# Patient Record
Sex: Male | Born: 1960 | Race: White
Health system: Southern US, Community
[De-identification: ages and names within clinical notes are randomized; demographics above are authoritative.]

## PROBLEM LIST (undated history)

## (undated) DIAGNOSIS — C801 Malignant (primary) neoplasm, unspecified: Secondary | ICD-10-CM

## (undated) DIAGNOSIS — R0602 Shortness of breath: Secondary | ICD-10-CM

## (undated) DIAGNOSIS — K5790 Diverticulosis of intestine, part unspecified, without perforation or abscess without bleeding: Secondary | ICD-10-CM

## (undated) DIAGNOSIS — Z9889 Other specified postprocedural states: Secondary | ICD-10-CM

## (undated) DIAGNOSIS — Z86010 Personal history of colonic polyps: Secondary | ICD-10-CM

## (undated) HISTORY — PX: EYE SURGERY: SHX253

## (undated) HISTORY — PX: COLONOSCOPY, REMOVAL OF TUMOR, POLYP, OR OTHER LESION BY SNARE TECHNIQUE: SHX3473

## (undated) HISTORY — DX: Shortness of breath: R06.02

## (undated) HISTORY — DX: Malignant (primary) neoplasm, unspecified: C80.1

## (undated) HISTORY — PX: TONSILLECTOMY: SUR1361

## (undated) HISTORY — DX: Other specified postprocedural states: Z98.890

## (undated) HISTORY — DX: Diverticulosis of intestine, part unspecified, without perforation or abscess without bleeding: K57.90

## (undated) HISTORY — DX: Personal history of colonic polyps: Z86.010

---

## 2015-10-01 DIAGNOSIS — Z9889 Other specified postprocedural states: Secondary | ICD-10-CM

## 2015-10-01 DIAGNOSIS — Z86010 Personal history of colonic polyps: Secondary | ICD-10-CM

## 2015-10-01 HISTORY — DX: Other specified postprocedural states: Z98.890

## 2015-10-01 HISTORY — DX: Personal history of colonic polyps: Z86.010

## 2015-12-07 ENCOUNTER — Ambulatory Visit
Admission: RE | Admit: 2015-12-07 | Discharge: 2015-12-07 | Disposition: A | Discharge status: Home / Self Care | Payer: BC Managed Care – PPO | Source: Ambulatory Visit | Attending: Gastroenterology | Admitting: Gastroenterology

## 2015-12-07 ENCOUNTER — Ambulatory Visit: Payer: BC Managed Care – PPO | Admitting: Anesthesiology

## 2015-12-07 ENCOUNTER — Ambulatory Visit: Payer: BC Managed Care – PPO | Admitting: Gastroenterology

## 2015-12-07 ENCOUNTER — Ambulatory Visit: Payer: Self-pay

## 2015-12-07 ENCOUNTER — Encounter
Admission: RE | Disposition: A | Discharge status: Home / Self Care | Payer: Self-pay | Source: Ambulatory Visit | Attending: Gastroenterology

## 2015-12-07 DIAGNOSIS — K648 Other hemorrhoids: Secondary | ICD-10-CM | POA: Insufficient documentation

## 2015-12-07 DIAGNOSIS — Z1211 Encounter for screening for malignant neoplasm of colon: Secondary | ICD-10-CM | POA: Insufficient documentation

## 2015-12-07 DIAGNOSIS — D123 Benign neoplasm of transverse colon: Secondary | ICD-10-CM | POA: Insufficient documentation

## 2015-12-07 DIAGNOSIS — Z8371 Family history of colonic polyps: Secondary | ICD-10-CM | POA: Insufficient documentation

## 2015-12-07 DIAGNOSIS — D120 Benign neoplasm of cecum: Secondary | ICD-10-CM | POA: Insufficient documentation

## 2015-12-07 DIAGNOSIS — K5730 Diverticulosis of large intestine without perforation or abscess without bleeding: Secondary | ICD-10-CM | POA: Insufficient documentation

## 2015-12-07 DIAGNOSIS — Z86010 Personal history of colonic polyps: Secondary | ICD-10-CM | POA: Insufficient documentation

## 2015-12-07 DIAGNOSIS — Z800 Family history of malignant neoplasm of digestive organs: Secondary | ICD-10-CM | POA: Insufficient documentation

## 2015-12-07 DIAGNOSIS — K644 Residual hemorrhoidal skin tags: Secondary | ICD-10-CM | POA: Insufficient documentation

## 2015-12-07 HISTORY — PX: COLONOSCOPY, WITH BIOPSY: SHX3468

## 2015-12-07 SURGERY — COLONOSCOPY, WITH BIOPSY
Anesthesia: Anesthesia General | Site: Abdomen | Wound class: Clean Contaminated

## 2015-12-07 MED ORDER — PROPOFOL INFUSION 10 MG/ML
INTRAVENOUS | Status: DC | PRN
Start: 2015-12-07 — End: 2015-12-07
  Administered 2015-12-07: 70 mg via INTRAVENOUS

## 2015-12-07 MED ORDER — LIDOCAINE HCL 2 % IJ SOLN
INTRAMUSCULAR | Status: DC | PRN
Start: 2015-12-07 — End: 2015-12-07
  Administered 2015-12-07: 30 mg via INTRAVENOUS

## 2015-12-07 MED ORDER — PROPOFOL INFUSION 10 MG/ML
INTRAVENOUS | Status: DC | PRN
Start: 2015-12-07 — End: 2015-12-07
  Administered 2015-12-07: 180 ug/kg/min via INTRAVENOUS

## 2015-12-07 MED ORDER — LACTATED RINGERS IV SOLN
INTRAVENOUS | Status: DC
Start: 2015-12-07 — End: 2015-12-07

## 2015-12-07 SURGICAL SUPPLY — 19 items
CANISTER 1000CC (Procedure Accessories) ×2 IMPLANT
FORCEPS BIOPSY L240 CM MICROMESH TEETH STREAMLINE CATHETER NEEDLE (Instrument) IMPLANT
FORCEPS BIOPSY L240 CM STANDARD CAPACITY (Instrument) ×1
FORCEPS BX STD CPC RJ 4 2.2MM 240CM STRL (Instrument) ×1
GOWN ISO YELLOW UNIVERSAL (Gown) ×4 IMPLANT
KIT UNIVERSAL IRRIGATION SOL (Kits) ×2 IMPLANT
SOFT-CUF 2T ADULT SUB-MIN (Cuff) ×2 IMPLANT
SOLN LUBRICATING JELLY 4.25OZ (Irrigation Solutions) ×2 IMPLANT
SPONGE GAUZE L4 IN X W4 IN 16 PLY (Dressing) ×10
SPONGE GAUZE L4 IN X W4 IN 16 PLY MAXIMUM ABSORBENT USP TYPE VII (Dressing) ×10 IMPLANT
SPONGE GZE CTTN CRTY 4X4IN LF NS 16 PLY (Dressing) ×10
SYRINGE 50 ML GRADUATE NONPYROGENIC DEHP (Syringes, Needles) ×1
SYRINGE 50 ML GRADUATE NONPYROGENIC DEHP FREE PVC FREE BD MEDICAL (Syringes, Needles) ×1 IMPLANT
SYRINGE MED 50ML LF STRL GRAD N-PYRG (Syringes, Needles) ×1
WATER STERILE PLASTIC POUR BOTTLE 1000 (Irrigation Solutions) ×1
WATER STERILE PLASTIC POUR BOTTLE 1000 ML (Irrigation Solutions) ×1 IMPLANT
WATER STERILE PLASTIC POUR BOTTLE 250 ML (Irrigation Solutions) ×1 IMPLANT
WATER STRL 1000ML PLS PR BTL LF (Irrigation Solutions) ×1
WATER STRL 250ML LF PLS PR BTL (Irrigation Solutions) ×1

## 2015-12-07 NOTE — Transfer of Care (Signed)
Anesthesia Transfer of Care Note    Patient: Jonathon Hudson    Procedures performed: Procedure(s):  COLONOSCOPY, BIOPSY    Anesthesia type: General TIVA    Patient location:[facility]    Last vitals:   Filed Vitals:    12/07/15 0920   BP: 99/63   Pulse: 55   Temp: 36.4 C (97.5 F)   Resp: 15   SpO2: 99%       Post pain: Patient not complaining of pain, continue current therapy      Mental Status:awake    Respiratory Function: tolerating room air    Cardiovascular: stable    Nausea/Vomiting: patient not complaining of nausea or vomiting    Hydration Status: adequate    Post assessment: no apparent anesthetic complications, no reportable events and no evidence of recall    Signed by: Leane Para  12/07/2015 9:20 AM

## 2015-12-07 NOTE — Anesthesia Postprocedure Evaluation (Signed)
Anesthesia Post Evaluation    Patient: Jonathon Hudson    Procedures performed: Procedure(s):  COLONOSCOPY, BIOPSY    Anesthesia type:  IV General    Patient location: [facility]    Last vitals:   Filed Vitals:    12/07/15 0935   BP: 116/78   Pulse: 54   Temp:  36.4 at 9:20 AM    Resp: 20   SpO2: 99%       Post pain: Patient not complaining of pain, continue current therapy     Mental Status:awake and alert     Respiratory Function: tolerating room air    Cardiovascular: stable    Nausea/Vomiting: patient not complaining of nausea or vomiting    Hydration Status: adequate    Post assessment: no apparent anesthetic complications and no reportable events    Tally Due, MD  12/07/2015  9:38 AM

## 2015-12-07 NOTE — Anesthesia Preprocedure Evaluation (Signed)
Anesthesia Evaluation    AIRWAY    Mallampati: III    TM distance: >3 FB  Neck ROM: full  Mouth Opening:full   CARDIOVASCULAR    regular       DENTAL    no notable dental hx     PULMONARY    clear to auscultation     OTHER FINDINGS                      Anesthesia Plan    ASA 1     general               (Patient interviewed,History, medications, allergies reviewed, patient examined.    Able to climb 2 FOSs without cardiorespiratory Sx.  Discussed IVGA.  Explained risks associated with existing medical conditions.  Answered all questions to patient's satisfaction.  Patient was willing to proceed.    Tally Due, MD  12/07/2015  8:00 AM  )                  informed consent obtained    Plan discussed with CRNA.

## 2015-12-07 NOTE — Discharge Instructions (Signed)
Endoscopy Discharge Instructions  General Instructions:  1. Following sedation, your judgement, perception, and coordination are considered impaired. Even though you may feel awake and alert, you are considered legally intoxicated. Therefore, until the next morning;   Do not Drive   Do not operate appliances or equipment that requires reaction time (e.g.    Stove, electrical tools, machinery)   Do not sign legal documents or be involved in important decisions.   Do not smoke if alone   Do not drink alcoholic beverages   Go directly home and rest for several hours before resuming your routine    activities.   It is highly recommended to have a responsible adult stay with you for the   next 24 hours    2. Tenderness, swelling or pain may occur at the IV site where you received sedation. If you experience this, apply warm soaks to the area. Notify your physician if this persists.    Instructions Specific To Procedures - Report To Physician Any Of The Following:    Upper Endoscopy, ERCP, Dilations   1. Pain in Chest   2. Nausea/vomitting   3. Fevers/Chills within 24 hours after procedure. Temp>101deg F   4. Severe and persistent abdominal pain and bloating     In Addition:   Mild throat soreness may follow this procedure. Warm salt water gargling or   lozenges of your choice will most likely relieve your discomfort or cold drinks and   popsicles.     Colon/Sigmoidoscopy/Proctoscopy   1. Severe and persistent abdominal pain/bloating which does not subside within 2-3 hours   2. Large amount of rectal bleeding (some mucosal blood streaking may occur, especially if biopsy or polypectomy was done or if hemorrhoids are present.   3. Nausea/vomitting   4. Fevers/Chills within 24 hours after procedure. Temp>101deg F     In Addition:\     If polyp has been removed, DO NOT take aspirin or aspirin containing products   (e.g. Anacin, Alka Seltzer, Bufferin, Etc.) or non-steroidal anti-inflammatory drugs  (e.g. Advil, Motrin,  etc.) for 14 days unless otherwise advised otherwise on your Patient Procedure Report and Recommendations. Tylenol or extra Strength Tylenol is permitted.        Additional Discharge Instructions  Your diet after the procedure: No restriction.  Special Instructions: See Procedure Patient Report Recommendations    If biopsies were taken, call Dr. Yicel Shannon at [phone] in one week for results    Patient education literature given; Yes      If you have questions or problems contact your MD immediately. If you need immediate attention, call your MD, 911 and/or go to nearest emergency room.

## 2015-12-07 NOTE — H&P (Signed)
GI PRE PROCEDURE NOTE    Proceduralist Comments:   Review of Systems and Past Medical / Surgical History performed: Yes     Indications:Family history of colon cancer and History of colonic polyps        Physical Exam / Laboratory Data (If applicable)       General: Alert and cooperative  Lungs: Lungs clear to auscultation  Cardiac: RRR, normal S1S2.    Abdomen: Soft, non tender. Normal active bowel sounds  Other:         Planned Sedation:   Deep sedation with anesthesia    Attestation:   Jonathon Hudson has been reassessed immediately prior to the procedure and is an appropriate candidate for the planned sedation and procedure. Risks, benefits and alternatives to the planned procedure and sedation have been explained to the patient or guardian:  yes        Signed by: Virgina Norfolk

## 2015-12-08 LAB — LAB USE ONLY - HISTORICAL SURGICAL PATHOLOGY

## 2015-12-12 ENCOUNTER — Encounter: Payer: Self-pay | Admitting: Gastroenterology

## 2018-11-13 ENCOUNTER — Other Ambulatory Visit (FREE_STANDING_LABORATORY_FACILITY): Payer: BC Managed Care – PPO

## 2018-11-13 DIAGNOSIS — Z125 Encounter for screening for malignant neoplasm of prostate: Secondary | ICD-10-CM

## 2018-11-13 DIAGNOSIS — G933 Postviral fatigue syndrome: Secondary | ICD-10-CM

## 2018-11-13 DIAGNOSIS — Z1159 Encounter for screening for other viral diseases: Secondary | ICD-10-CM

## 2018-11-13 DIAGNOSIS — Z139 Encounter for screening, unspecified: Secondary | ICD-10-CM

## 2018-11-13 LAB — LIPID PANEL
Cholesterol / HDL Ratio: 4.6
Cholesterol: 194 mg/dL (ref 0–199)
HDL: 42 mg/dL (ref 40–9999)
LDL Calculated: 131 mg/dL — ABNORMAL HIGH (ref 0–99)
Triglycerides: 107 mg/dL (ref 34–149)
VLDL Calculated: 21 mg/dL (ref 10–40)

## 2018-11-13 LAB — GFR: EGFR: >60

## 2018-11-13 LAB — CBC AND DIFFERENTIAL
Absolute NRBC: 0 10*3/uL (ref 0.00–0.00)
Basophils Absolute Automated: 0.05 10*3/uL (ref 0.00–0.08)
Basophils Automated: 0.8 %
Eosinophils Absolute Automated: 0.17 10*3/uL (ref 0.00–0.44)
Eosinophils Automated: 2.6 %
Hematocrit: 46.2 % (ref 37.6–49.6)
Hgb: 15.2 g/dL (ref 12.5–17.1)
Immature Granulocytes Absolute: 0.02 10*3/uL (ref 0.00–0.07)
Immature Granulocytes: 0.3 %
Lymphocytes Absolute Automated: 2.05 10*3/uL (ref 0.42–3.22)
Lymphocytes Automated: 31.7 %
MCH: 28.1 pg (ref 25.1–33.5)
MCHC: 32.9 g/dL (ref 31.5–35.8)
MCV: 85.6 fL (ref 78.0–96.0)
MPV: 11.2 fL (ref 8.9–12.5)
Monocytes Absolute Automated: 0.47 10*3/uL (ref 0.21–0.85)
Monocytes: 7.3 %
Neutrophils Absolute: 3.7 10*3/uL (ref 1.10–6.33)
Neutrophils: 57.3 %
Nucleated RBC: 0 /100 WBC (ref 0.0–0.0)
Platelets: 210 10*3/uL (ref 142–346)
RBC: 5.4 10*6/uL (ref 4.20–5.90)
RDW: 13 % (ref 11–15)
WBC: 6.46 10*3/uL (ref 3.10–9.50)

## 2018-11-13 LAB — COMPREHENSIVE METABOLIC PANEL
ALT: 22 U/L (ref 0–55)
AST (SGOT): 20 U/L (ref 5–34)
Albumin/Globulin Ratio: 1.3 (ref 0.9–2.2)
Albumin: 3.7 g/dL (ref 3.5–5.0)
Alkaline Phosphatase: 73 U/L (ref 38–106)
BUN: 12 mg/dL (ref 9.0–28.0)
Bilirubin, Total: 0.7 mg/dL (ref 0.2–1.2)
CO2: 26 mEq/L (ref 21–29)
Calcium: 9.2 mg/dL (ref 8.5–10.5)
Chloride: 106 mEq/L (ref 100–111)
Creatinine: 0.8 mg/dL (ref 0.5–1.5)
Globulin: 2.8 g/dL (ref 2.0–3.7)
Glucose: 93 mg/dL (ref 70–100)
Potassium: 4 mEq/L (ref 3.5–5.1)
Protein, Total: 6.5 g/dL (ref 6.0–8.3)
Sodium: 142 mEq/L (ref 136–145)

## 2018-11-13 LAB — FOLATE: Folate: 6.2 ng/mL

## 2018-11-13 LAB — C-REACTIVE PROTEIN HIGH SENSITIVE: C-Reactive Protein, High Sensitive: 0.14 mg/dL (ref 0.00–0.50)

## 2018-11-13 LAB — VITAMIN B12: Vitamin B-12: 426 pg/mL (ref 211–911)

## 2018-11-13 LAB — URIC ACID: Uric acid: 6.6 mg/dL (ref 3.6–9.7)

## 2018-11-13 LAB — HEPATITIS C ANTIBODY: Hepatitis C, AB: NONREACTIVE

## 2018-11-13 LAB — HIV AG/AB 4TH GENERATION: HIV Ag/Ab, 4th Generation: NONREACTIVE

## 2018-11-13 LAB — PSA: Prostate Specific Antigen, Total: 1.066 ng/mL (ref 0.000–4.000)

## 2018-11-13 LAB — VITAMIN D,25 OH,TOTAL: Vitamin D, 25 OH, Total: 19 ng/mL — ABNORMAL LOW (ref 30–100)

## 2018-11-13 LAB — HEMOLYSIS INDEX: Hemolysis Index: 14 (ref 0–18)

## 2018-11-16 LAB — EPSTEIN-BARR VIRUS VCA ANTIBODY PANEL
EBV EBNA Ab, IgG: 8.46 — ABNORMAL HIGH (ref <=0.90)
EBV VCA Ab, IgG: 5.83 — ABNORMAL HIGH (ref <=0.90)
EBV VCA Ab, IgM: 0.17 (ref <=0.90)

## 2018-12-03 ENCOUNTER — Ambulatory Visit: Payer: BC Managed Care – PPO | Admitting: Critical Care Medicine

## 2018-12-03 ENCOUNTER — Encounter
Admission: RE | Disposition: A | Discharge status: Home / Self Care | Payer: Self-pay | Source: Ambulatory Visit | Attending: Gastroenterology

## 2018-12-03 ENCOUNTER — Ambulatory Visit
Admission: RE | Admit: 2018-12-03 | Discharge: 2018-12-03 | Disposition: A | Discharge status: Home / Self Care | Payer: BC Managed Care – PPO | Source: Ambulatory Visit | Attending: Gastroenterology | Admitting: Gastroenterology

## 2018-12-03 ENCOUNTER — Encounter: Payer: Self-pay | Admitting: Anesthesiology

## 2018-12-03 ENCOUNTER — Ambulatory Visit: Payer: Self-pay

## 2018-12-03 DIAGNOSIS — Z800 Family history of malignant neoplasm of digestive organs: Secondary | ICD-10-CM

## 2018-12-03 DIAGNOSIS — Z8371 Family history of colonic polyps: Secondary | ICD-10-CM

## 2018-12-03 DIAGNOSIS — D123 Benign neoplasm of transverse colon: Secondary | ICD-10-CM | POA: Insufficient documentation

## 2018-12-03 DIAGNOSIS — Z86010 Personal history of colonic polyps: Secondary | ICD-10-CM | POA: Insufficient documentation

## 2018-12-03 DIAGNOSIS — K5730 Diverticulosis of large intestine without perforation or abscess without bleeding: Secondary | ICD-10-CM | POA: Insufficient documentation

## 2018-12-03 DIAGNOSIS — D120 Benign neoplasm of cecum: Secondary | ICD-10-CM | POA: Insufficient documentation

## 2018-12-03 DIAGNOSIS — K648 Other hemorrhoids: Secondary | ICD-10-CM | POA: Insufficient documentation

## 2018-12-03 HISTORY — PX: COLONOSCOPY, WITH BIOPSY: SHX3468

## 2018-12-03 SURGERY — COLONOSCOPY, WITH BIOPSY
Anesthesia: Anesthesia General | Site: Abdomen | Wound class: Clean Contaminated

## 2018-12-03 MED ORDER — PROPOFOL 10 MG/ML IV EMUL (WRAP)
INTRAVENOUS | Status: AC
Start: 2018-12-03 — End: ?
  Filled 2018-12-03: qty 50

## 2018-12-03 MED ORDER — LIDOCAINE HCL (PF) 2 % IJ SOLN
INTRAMUSCULAR | Status: AC
Start: 2018-12-03 — End: ?
  Filled 2018-12-03: qty 5

## 2018-12-03 MED ORDER — PROPOFOL INFUSION 10 MG/ML
INTRAVENOUS | Status: DC | PRN
Start: 2018-12-03 — End: 2018-12-03
  Administered 2018-12-03 (×2): 20 mg via INTRAVENOUS
  Administered 2018-12-03: 80 mg via INTRAVENOUS

## 2018-12-03 MED ORDER — LIDOCAINE HCL 2 % IJ SOLN
INTRAMUSCULAR | Status: DC | PRN
Start: 2018-12-03 — End: 2018-12-03
  Administered 2018-12-03: 40 mg via INTRAVENOUS

## 2018-12-03 MED ORDER — PROPOFOL INFUSION 10 MG/ML
INTRAVENOUS | Status: DC | PRN
Start: 2018-12-03 — End: 2018-12-03
  Administered 2018-12-03: 180 ug/kg/min via INTRAVENOUS

## 2018-12-03 MED ORDER — EPHEDRINE SULFATE 50 MG/ML IJ/IV SOLN (WRAP)
Status: AC
Start: 2018-12-03 — End: ?
  Filled 2018-12-03: qty 1

## 2018-12-03 MED ORDER — EPHEDRINE SULFATE 50 MG/ML IJ/IV SOLN (WRAP)
Status: DC | PRN
Start: 2018-12-03 — End: 2018-12-03
  Administered 2018-12-03: 10 mg via INTRAVENOUS

## 2018-12-03 MED ORDER — LACTATED RINGERS IV SOLN
INTRAVENOUS | Status: DC | PRN
Start: 2018-12-03 — End: 2018-12-03

## 2018-12-03 SURGICAL SUPPLY — 19 items
CANISTER 1000CC (Procedure Accessories) ×2 IMPLANT
GOWN ISO YELLOW UNIVERSAL (Gown) ×4 IMPLANT
JELLY LUB EZ LF STRL H2O SOL NGRS TRNLU (Irrigation Solutions) ×2 IMPLANT
KIT UNIVERSAL IRRIGATION SOL (Kits) ×2 IMPLANT
SNARE 9 MM L230 CM OD2.4 MM COLD BRAID (Instrument) ×1
SNARE 9 MM L230 CM OD2.4 MM EXACTO COLD BRAID WIRE CLEAN CUT (Instrument) IMPLANT
SNARE ESCP 9MM EXACTO 2.4MM 230CM LF (Instrument) ×1
SOFT-CUF 2T ADULT SUB-MIN (Cuff) ×2 IMPLANT
SPONGE GAUZE L4 IN X W4 IN 16 PLY (Dressing) ×10
SPONGE GAUZE L4 IN X W4 IN 16 PLY MAXIMUM ABSORBENT USP TYPE VII (Dressing) ×10 IMPLANT
SPONGE GZE CTTN CRTY 4X4IN LF NS 16 PLY (Dressing) ×10
SYRINGE 50 ML GRADUATE NONPYROGENIC DEHP (Syringes, Needles) ×1
SYRINGE 50 ML GRADUATE NONPYROGENIC DEHP FREE PVC FREE BD MEDICAL (Syringes, Needles) ×1 IMPLANT
SYRINGE MED 50ML LF STRL GRAD N-PYRG (Syringes, Needles) ×1
WATER STERILE PLASTIC POUR BOTTLE 1000 (Irrigation Solutions) ×1
WATER STERILE PLASTIC POUR BOTTLE 1000 ML (Irrigation Solutions) ×1 IMPLANT
WATER STERILE PLASTIC POUR BOTTLE 250 ML (Irrigation Solutions) ×1 IMPLANT
WATER STRL 1000ML PLS PR BTL LF (Irrigation Solutions) ×1
WATER STRL 250ML LF PLS PR BTL (Irrigation Solutions) ×1

## 2018-12-03 NOTE — Discharge Instructions (Signed)
Diverticulosis    Diverticulosis means that small pouches have formed in the wall of your large intestine (colon). Most often, this problem causes no symptoms and is common as people age. But the pouches in the colon are at risk of becoming infected. When this happens, the condition is called diverticulitis. Although most people with diverticulosis never develop diverticulitis, it is still not uncommon. Rectal bleeding can also occur and in less common situations, a type of colon inflammation called colitis.  While most people don't have symptoms, some people with diverticulosis may have:  · Abdominal cramps and pain  · Bloating  · Constipation  · Change in bowel habits  Causes  The exact cause of diverticulosis (and diverticulitis) has not been proved, but a few things are associated with the condition:  · Low-fiber diet  · Constipation  · Lack of exercise  Your healthcare provider will talk with you about how to manage your condition. Diet changes may be all that are needed to help control diverticulosis and prevent progression to diverticulitis. If you develop diverticulitis, you will likely need other treatments.  Home care  You may be told to take fiber supplements daily. Fiber adds bulk to the stool so that it passes through the colon more easily. Stool softeners may also be recommended. You may also be given medicines for pain relief. Be sure to take all medicines as directed.  In the past, people were told to avoid corn, nuts, and seeds. This is no longer necessary.  Follow these guidelines when caring for yourself at home:  · Eat unprocessed foods that are high in fiber. Whole-grains, fruits, and vegetables are good choices.  · Drink 6 to 8 glasses of water every day unless your healthcare provider has you limit how much fluid you should have.  · Watch for changes in your bowel movements. Tell your provider if you notice any changes.  · Begin an exercise program. Ask your provider how to get started.  Generally, walking is the best.  · Get plenty of rest and sleep.  Follow-up care  Follow up with your healthcare provider, or as advised. Regular visits may be needed to check on your health. Sometimes special procedures such as colonoscopy, are needed after an episode of diverticulitis or blooding. Be sure to keep all your appointments.  If a stool sample was taken, or cultures were done, you should be told if they are positive, or if your treatment needs to be changed. You can call as directed for the results.  If X-rays were done, a radiologist will look at them. You will be told if there is a change in your treatment.  If antibiotics were prescribed, be sure to finish them all.  When to seek medical advice  Call your healthcare provider right away if any of these occur:  · Fever of 100.4°F (38°C) or higher, or as directed by your healthcare provider  · Severe cramps in the lower left side of the abdomen or pain that is getting worse  · Tenderness in the lower left side of the abdomen or worsening pain throughout the abdomen  · Diarrhea or constipation that doesn't get better within 24 hours  · Nausea and vomiting  · Bleeding from the rectum  Call 911  Call 911 if any of the following occur:  · Trouble breathing  · Confusion  · Very drowsy or trouble awakening  · Fainting or loss of consciousness  · Rapid heart rate  · Chest pain  [employer] last reviewed this educational content on 02/28/2017  © 2000-2020   [employer] [address], [place] [address]. All rights reserved. This information is not intended as a substitute for professional medical care. Always follow your healthcare professional's instructions.        Hemorrhoids    Hemorrhoids are swollen and inflamed veins inside the rectum and near the anus. The rectum is the last several inches of the colon. The anus is the passage between the rectum and the outside of the body.   Causes  The veins can become swollen due to increased pressure  in them. This is most often caused by:   · Chronic constipation or diarrhea  · Straining when having a bowel movement  · Sitting too long on the toilet  · A low-fiber diet  · Pregnancy  Symptoms  · Bleeding from the rectum. You may notice this after bowel movements.  · Lump near the anus  · Itching around the anus  · Pain around the anus  · Mucus leaks from the anus  There are different types of hemorrhoids. Depending on the type you have and the severity, you may be able to treat yourself at home. In some cases, a procedure may be the best treatment option. Your healthcare provider can tell you more about this, if needed.   Home care  General care  · To get relief from pain or itching, try:  ? Medicines. Your healthcare provider may recommend stool softeners, suppositories, or laxatives to help manage constipation. Use these exactly as directed.  ? Sitz baths. A sitz bath involves sitting in a few inches of warm bath water. Be careful not to make the water so hot that you burn yourself--test it before sitting in it. Soak for about 10 to 15 minutes a few times a day. This may help relieve pain.  ? Topical products. Your healthcare provider may prescribe or recommend creams, ointments, or pads that can be applied to the hemorrhoid. Use these exactly as directed.  Tips to help prevent hemorrhoids   · Eat more fiber. Fiber adds bulk to stool and absorbs water as it moves through your colon. This makes stool softer and easier to pass.  ? Increase the fiber in your diet with more fiber-rich foods. These include fresh fruit, vegetables, and whole grains.  ? Take a fiber supplement or bulking agent, if advised by your healthcare provider. These include products such as psyllium or methylcellulose.  · Drink more water. Your healthcare provider may direct you to drink plenty of water. This can help keep stool soft.  · Be more active. Frequent exercise aids digestion and helps prevent constipation. It may also help make bowel  movements more regular.  · Don’t strain during bowel movements. This can make hemorrhoids more likely. Also, don’t sit on the toilet for long periods of time.    Follow-up care  Follow up with your healthcare provider as advised. If a culture or imaging tests were done, someone will let you know the results when they are ready. This may take a few days or longer. If your healthcare provider recommends a procedure for your hemorrhoids, these options can be discussed. Options may include surgery and outpatient office treatments.   When to seek medical advice  Call your healthcare provider right away if any of these occur:   · Increased bleeding from the rectum  · Increased pain around the rectum or anus  · Weakness or dizziness  Call 911  Call 911 if any of these occur:   ·   Trouble breathing or swallowing  · Fainting or loss of consciousness  · Unusually fast heart rate  · Vomiting blood  · Large amounts of blood in stool or black, tarry stools  [employer] last reviewed this educational content on 04/30/2018  © 2000-2020 [employer] [address], [place] [address]. All rights reserved. This information is not intended as a substitute for professional medical care. Always follow your healthcare professional's instructions.        Understanding Colon and Rectal Polyps    The colon (also called the large intestine) is a muscular tube that forms the last part of the digestive tract. It absorbs water and stores food waste. The colon is about 4 to 6 feet long. The rectum is the last 6 inches of the colon. The colon and rectum have a smooth lining composed of millions of cells. Changes in these cells can lead to growths in the colon that can become cancerous and should be removed. Multiple tests are available to screen for colon cancer, but the colonoscopy is the most recommended test. During colonoscopy, these polyps can be removed. How often you need this test depends on many things including your  condition, your family history, symptoms, and what the findings were at the previous colonoscopy.    When the colon lining changes  Changes that happen in the cells that line the colon or rectum can lead to growths called polyps. Over a period of years, polyps can turn cancerous. Removing polyps early may prevent cancer from ever forming.   Polyps  Polyps are fleshy clumps of tissue that form on the lining of the colon or rectum. Small polyps are usually benign (not cancerous). However, over time, cells in a polyp can change and become cancerous. Certain types of polyps known as adenomatous polyps and serrated polyps are pre-cancerous. The risk for cancer increases with the size of the polyp and certain cell and gene features. This means that they can become cancerous if they're not removed. Hyperplastic polyps are benign. They can grow quite large and not turn cancerous.    Cancer  Almost all colorectal cancers start when polyp cells starts growing abnormally. As a cancerous tumor grows, it may involve more and more of the colon or rectum. In time, cancer can also grow beyond the colon or rectum and spread to nearby organs or to glands called lymph nodes. The cells can also travel to other parts of the body. This is known as metastasis. The earlier a cancerous tumor is removed, the better the chance of preventing its spread.     [employer] last reviewed this educational content on 02/28/2018  © 2000-2020 [employer] [address], [place] [address]. All rights reserved. This information is not intended as a substitute for professional medical care. Always follow your healthcare professional's instructions.

## 2018-12-03 NOTE — Discharge Instr - AVS First Page (Signed)
Endoscopy Discharge Instructions  General Instructions:  1. Following sedation, your judgement, perception, and coordination are considered impaired. Even though you may feel awake and alert, you are considered legally intoxicated. Therefore, until the next morning;   Do not Drive   Do not operate appliances or equipment that requires reaction time (e.g.    Stove, electrical tools, machinery)   Do not sign legal documents or be involved in important decisions.   Do not smoke if alone   Do not drink alcoholic beverages   Go directly home and rest for several hours before resuming your routine    activities.   It is highly recommended to have a responsible adult stay with you for the   next 24 hours    2. Tenderness, swelling or pain may occur at the IV site where you received sedation. If you experience this, apply warm soaks to the area. Notify your physician if this persists.    Instructions Specific To Procedures - Report To Physician Any Of The Following:    Upper Endoscopy, ERCP, Dilations   1. Pain in Chest   2. Nausea/vomitting   3. Fevers/Chills within 24 hours after procedure. Temp>101deg F   4. Severe and persistent abdominal pain and bloating     In Addition:   Mild throat soreness may follow this procedure. Warm salt water gargling or   lozenges of your choice will most likely relieve your discomfort or cold drinks and   popsicles.     Colon/Sigmoidoscopy/Proctoscopy   1. Severe and persistent abdominal pain/bloating which does not subside within 2-3 hours   2. Large amount of rectal bleeding (some mucosal blood streaking may occur, especially if biopsy or polypectomy was done or if hemorrhoids are present.   3. Nausea/vomitting   4. Fevers/Chills within 24 hours after procedure. Temp>101deg F     In Addition:\     If polyp has been removed, DO NOT take aspirin or aspirin containing products   (e.g. Anacin, Alka Seltzer, Bufferin, Etc.) or non-steroidal anti-inflammatory drugs  (e.g. Advil, Motrin,  etc.) for 14 days unless otherwise advised otherwise on your Patient Procedure Report and Recommendations. Tylenol or extra Strength Tylenol is permitted.        Additional Discharge Instructions  Your diet after the procedure: No restriction.  Special Instructions: See Procedure Patient Report Recommendations    If biopsies were taken, call Dr. Ashni Lonzo at [phone] in one week for results    Patient education literature given; Yes      If you have questions or problems contact your MD immediately. If you need immediate attention, call your MD, 911 and/or go to nearest emergency room.

## 2018-12-03 NOTE — Anesthesia Preprocedure Evaluation (Signed)
Anesthesia Evaluation    AIRWAY    Mallampati: II    TM distance: >3 FB  Neck ROM: full  Mouth Opening:full  Planned to use difficult airway equipment: No CARDIOVASCULAR    cardiovascular exam normal, regular and normal       DENTAL         PULMONARY    pulmonary exam normal and clear to auscultation     OTHER FINDINGS    Light bearding              Relevant Problems   No relevant active problems     SOB, melanoma            Anesthesia Plan    ASA 2     general               (Patient has been assessed immediately prior to the procedure and is an appropriate candidate for the planned sedation and procedure.  Risks and benefits of IV GA/TIVA sedation  for the planned procedure have been explained to the patient including possible ETT placement should it become necessary for patient safety.      Joya Gaskins, MD  12/03/2018)      Detailed anesthesia plan: general IV        Post op pain management: per surgeon    informed consent obtained    Plan discussed with CRNA.                   Signed by: Joya Gaskins 12/03/18 10:34 AM

## 2018-12-03 NOTE — Transfer of Care (Signed)
Anesthesia Transfer of Care Note    Patient: Jonathon Hudson    Procedures performed: Procedure(s):  COLONOSCOPY, BIOPSY    Anesthesia type: General TIVA    Patient location:[facility]    Last vitals:   Vitals:    12/03/18 1214   BP: 95/53   Pulse: 62   Resp: 14   Temp: 36.1 C (97 F)   SpO2: 98%       Post pain: Patient not complaining of pain, continue current therapy      Mental Status:drowsy    Respiratory Function: tolerating room air    Cardiovascular: stable    Nausea/Vomiting: patient not complaining of nausea or vomiting    Hydration Status: adequate    Post assessment: no apparent anesthetic complications and no reportable events    Signed by: Allena Earing  12/03/18 12:15 PM

## 2018-12-03 NOTE — Anesthesia Postprocedure Evaluation (Signed)
Anesthesia Post Evaluation    Patient: Jonathon Hudson    Procedure(s):  COLONOSCOPY, BIOPSY    Anesthesia type: general    Last Vitals:   Vitals Value Taken Time   BP 117/74 12/03/2018 12:40 PM   Temp 36.1 C (97 F) 12/03/2018 12:14 PM   Pulse 58 12/03/2018 12:40 PM   Resp 17 12/03/2018 12:40 PM   SpO2 100 % 12/03/2018 12:40 PM                 Anesthesia Post Evaluation:     Patient Evaluated: PACU  Patient Participation: complete - patient participated  Level of Consciousness: awake    Pain Management: adequate    Airway Patency: patent    Anesthetic complications: No      PONV Status: none    Cardiovascular status: acceptable  Respiratory status: acceptable and room air  Hydration status: acceptable  Comments: No anesthetic complications        Signed by: Joya Gaskins, 12/03/2018 1:03 PM

## 2018-12-03 NOTE — H&P (Signed)
GI PRE PROCEDURE NOTE    Proceduralist Comments:   Review of Systems and Past Medical / Surgical History performed: Yes     Indications:Family history of colon cancer, History of colonic polyps and Family history of colon polyps    No outpatient medications have been marked as taking for the 12/03/18 encounter [facility] Encounter).       Allergies   Allergen Reactions    Dilantin [Phenytoin] Rash    Naproxen Hives       Social History - non-contributory  Family History - non-contributory    Review of Systems - General ROS: negative for - chills, fever or malaise  Respiratory ROS: no cough, shortness of breath, or wheezing  Cardiovascular ROS: no chest pain or dyspnea on exertion  Gastrointestinal ROS: no abdominal pain, change in bowel habits, or black or bloody stools      Physical Exam / Laboratory Data (If applicable)       General: Alert and cooperative  Lungs: Lungs clear to auscultation  Cardiac: RRR, normal S1S2.    Abdomen: Soft, non tender. Normal active bowel sounds  Neurological: denies headache weakness  Other:       History of colon polyps [Z86.010]  Family history of polyps in the colon [Z83.71]  Family history of colon cancer [Z80.0]          colonoscopy with possible biopsy/polypectomy        Planned Sedation:   Deep sedation with anesthesia    Attestation:   Jonathon Hudson has been reassessed immediately prior to the procedure and is an appropriate candidate for the planned sedation and procedure. Risks, benefits and alternatives to the planned procedure and sedation have been explained to the patient or guardian:  yes        Signed by: Virgina Norfolk

## 2018-12-04 LAB — LAB USE ONLY - HISTORICAL SURGICAL PATHOLOGY

## 2018-12-05 ENCOUNTER — Encounter: Payer: Self-pay | Admitting: Gastroenterology

## 2019-05-13 ENCOUNTER — Other Ambulatory Visit (FREE_STANDING_LABORATORY_FACILITY): Payer: BC Managed Care – PPO

## 2019-05-13 DIAGNOSIS — E7801 Familial hypercholesterolemia: Secondary | ICD-10-CM

## 2019-05-13 DIAGNOSIS — Z125 Encounter for screening for malignant neoplasm of prostate: Secondary | ICD-10-CM

## 2019-05-13 DIAGNOSIS — E559 Vitamin D deficiency, unspecified: Secondary | ICD-10-CM

## 2019-05-13 DIAGNOSIS — Z139 Encounter for screening, unspecified: Secondary | ICD-10-CM

## 2019-05-13 LAB — CBC AND DIFFERENTIAL
Absolute NRBC: 0 10*3/uL (ref 0.00–0.00)
Basophils Absolute Automated: 0.06 10*3/uL (ref 0.00–0.08)
Basophils Automated: 0.9 %
Eosinophils Absolute Automated: 0.19 10*3/uL (ref 0.00–0.44)
Eosinophils Automated: 2.8 %
Hematocrit: 47.5 % (ref 37.6–49.6)
Hgb: 15.3 g/dL (ref 12.5–17.1)
Immature Granulocytes Absolute: 0.02 10*3/uL (ref 0.00–0.07)
Immature Granulocytes: 0.3 %
Lymphocytes Absolute Automated: 1.92 10*3/uL (ref 0.42–3.22)
Lymphocytes Automated: 27.9 %
MCH: 27.9 pg (ref 25.1–33.5)
MCHC: 32.2 g/dL (ref 31.5–35.8)
MCV: 86.5 fL (ref 78.0–96.0)
MPV: 11.1 fL (ref 8.9–12.5)
Monocytes Absolute Automated: 0.45 10*3/uL (ref 0.21–0.85)
Monocytes: 6.5 %
Neutrophils Absolute: 4.25 10*3/uL (ref 1.10–6.33)
Neutrophils: 61.6 %
Nucleated RBC: 0 /100 WBC (ref 0.0–0.0)
Platelets: 199 10*3/uL (ref 142–346)
RBC: 5.49 10*6/uL (ref 4.20–5.90)
RDW: 13 % (ref 11–15)
WBC: 6.89 10*3/uL (ref 3.10–9.50)

## 2019-05-13 LAB — GFR: EGFR: >60

## 2019-05-13 LAB — COMPREHENSIVE METABOLIC PANEL
ALT: 29 U/L (ref 0–55)
AST (SGOT): 23 U/L (ref 5–34)
Albumin/Globulin Ratio: 1.3 (ref 0.9–2.2)
Albumin: 3.6 g/dL (ref 3.5–5.0)
Alkaline Phosphatase: 76 U/L (ref 38–106)
Anion Gap: 9 (ref 5.0–15.0)
BUN: 12 mg/dL (ref 9.0–28.0)
Bilirubin, Total: 0.8 mg/dL (ref 0.2–1.2)
CO2: 25 mEq/L (ref 21–29)
Calcium: 8.8 mg/dL (ref 8.5–10.5)
Chloride: 108 mEq/L (ref 100–111)
Creatinine: 0.9 mg/dL (ref 0.5–1.5)
Globulin: 2.8 g/dL (ref 2.0–3.7)
Glucose: 96 mg/dL (ref 70–100)
Potassium: 4.2 mEq/L (ref 3.5–5.1)
Protein, Total: 6.4 g/dL (ref 6.0–8.3)
Sodium: 142 mEq/L (ref 136–145)

## 2019-05-13 LAB — HIV AG/AB 4TH GENERATION: HIV Ag/Ab, 4th Generation: NONREACTIVE

## 2019-05-13 LAB — LIPID PANEL
Cholesterol / HDL Ratio: 5.4
Cholesterol: 214 mg/dL — ABNORMAL HIGH (ref 0–199)
HDL: 40 mg/dL (ref 40–9999)
LDL Calculated: 147 mg/dL — ABNORMAL HIGH (ref 0–99)
Triglycerides: 133 mg/dL (ref 34–149)
VLDL Calculated: 27 mg/dL (ref 10–40)

## 2019-05-13 LAB — HEMOLYSIS INDEX: Hemolysis Index: 7 (ref 0–18)

## 2019-05-13 LAB — PSA: Prostate Specific Antigen, Total: 1.153 ng/mL (ref 0.000–4.000)

## 2019-05-13 LAB — URIC ACID: Uric acid: 6.8 mg/dL (ref 3.6–9.7)

## 2019-05-13 LAB — VITAMIN D,25 OH,TOTAL: Vitamin D, 25 OH, Total: 41 ng/mL (ref 30–100)

## 2019-07-21 ENCOUNTER — Other Ambulatory Visit (FREE_STANDING_LABORATORY_FACILITY): Payer: BC Managed Care – PPO

## 2019-07-21 DIAGNOSIS — Z79899 Other long term (current) drug therapy: Secondary | ICD-10-CM

## 2019-07-21 DIAGNOSIS — E7801 Familial hypercholesterolemia: Secondary | ICD-10-CM

## 2019-07-21 LAB — BASIC METABOLIC PANEL
Anion Gap: 10 (ref 5.0–15.0)
BUN: 12 mg/dL (ref 9.0–28.0)
CO2: 27 mEq/L (ref 21–29)
Calcium: 9.5 mg/dL (ref 8.5–10.5)
Chloride: 106 mEq/L (ref 100–111)
Creatinine: 1 mg/dL (ref 0.5–1.5)
Glucose: 90 mg/dL (ref 70–100)
Potassium: 4.8 mEq/L (ref 3.5–5.1)
Sodium: 143 mEq/L (ref 136–145)

## 2019-07-21 LAB — LIPID PANEL
Cholesterol / HDL Ratio: 3.4
Cholesterol: 171 mg/dL (ref 0–199)
HDL: 50 mg/dL (ref 40–9999)
LDL Calculated: 95 mg/dL (ref 0–99)
Triglycerides: 131 mg/dL (ref 34–149)
VLDL Calculated: 26 mg/dL (ref 10–40)

## 2019-07-21 LAB — ALT: ALT: 38 U/L (ref 0–55)

## 2019-07-21 LAB — GFR: EGFR: >60

## 2019-07-21 LAB — AST: AST (SGOT): 27 U/L (ref 5–34)

## 2019-07-21 LAB — HEMOLYSIS INDEX: Hemolysis Index: 10 (ref 0–18)

## 2019-11-08 ENCOUNTER — Other Ambulatory Visit (FREE_STANDING_LABORATORY_FACILITY): Payer: BC Managed Care – PPO

## 2019-11-08 DIAGNOSIS — Z79899 Other long term (current) drug therapy: Secondary | ICD-10-CM

## 2019-11-08 DIAGNOSIS — E7801 Familial hypercholesterolemia: Secondary | ICD-10-CM

## 2019-11-08 LAB — LIPID PANEL
Cholesterol / HDL Ratio: 3.7
Cholesterol: 148 mg/dL (ref 0–199)
HDL: 40 mg/dL (ref 40–9999)
LDL Calculated: 92 mg/dL (ref 0–99)
Triglycerides: 78 mg/dL (ref 34–149)
VLDL Calculated: 16 mg/dL (ref 10–40)

## 2019-11-08 LAB — BASIC METABOLIC PANEL
Anion Gap: 9 (ref 5.0–15.0)
BUN: 19 mg/dL (ref 9.0–28.0)
CO2: 26 mEq/L (ref 21–29)
Calcium: 9 mg/dL (ref 8.5–10.5)
Chloride: 107 mEq/L (ref 100–111)
Creatinine: 0.9 mg/dL (ref 0.5–1.5)
Glucose: 89 mg/dL (ref 70–100)
Potassium: 4.1 mEq/L (ref 3.5–5.1)
Sodium: 142 mEq/L (ref 136–145)

## 2019-11-08 LAB — AST: AST (SGOT): 27 U/L (ref 5–34)

## 2019-11-08 LAB — ALT: ALT: 42 U/L (ref 0–55)

## 2019-11-08 LAB — VITAMIN D,25 OH,TOTAL: Vitamin D, 25 OH, Total: 85 ng/mL (ref 30–100)

## 2019-11-08 LAB — VITAMIN B12: Vitamin B-12: 349 pg/mL (ref 211–911)

## 2019-11-08 LAB — HEMOLYSIS INDEX: Hemolysis Index: 7 (ref 0–18)

## 2019-11-08 LAB — GFR: EGFR: >60

## 2020-05-03 LAB — VARICELLA ZOSTER ANTIBODY, IGG: Varicella, IgG: >4000

## 2020-05-03 LAB — MUMPS ANTIBODY, IGG: Mumps Ab, IgG: 217

## 2020-05-03 LAB — RUBEOLA ANTIBODY IGG: Rubeola (Measles), IgG: >300

## 2020-05-03 LAB — RUBELLA ANTIBODY, IGG: Rubella AB, IgG: 16.7

## 2020-05-04 ENCOUNTER — Other Ambulatory Visit (FREE_STANDING_LABORATORY_FACILITY): Payer: BC Managed Care – PPO

## 2020-05-04 DIAGNOSIS — E7801 Familial hypercholesterolemia: Secondary | ICD-10-CM

## 2020-05-04 DIAGNOSIS — Z79899 Other long term (current) drug therapy: Secondary | ICD-10-CM

## 2020-05-04 DIAGNOSIS — Z125 Encounter for screening for malignant neoplasm of prostate: Secondary | ICD-10-CM

## 2020-05-04 LAB — BASIC METABOLIC PANEL
Anion Gap: 10 (ref 5.0–15.0)
BUN: 17 mg/dL (ref 9.0–28.0)
CO2: 27 mEq/L (ref 21–29)
Calcium: 9.6 mg/dL (ref 8.5–10.5)
Chloride: 105 mEq/L (ref 100–111)
Creatinine: 0.9 mg/dL (ref 0.5–1.5)
Glucose: 88 mg/dL (ref 70–100)
Potassium: 4.6 mEq/L (ref 3.5–5.1)
Sodium: 142 mEq/L (ref 136–145)

## 2020-05-04 LAB — HEMOLYSIS INDEX: Hemolysis Index: 6 (ref 0–18)

## 2020-05-04 LAB — LIPID PANEL
Cholesterol / HDL Ratio: 3.4
Cholesterol: 142 mg/dL (ref 0–199)
HDL: 42 mg/dL (ref 40–9999)
LDL Calculated: 79 mg/dL (ref 0–99)
Triglycerides: 104 mg/dL (ref 34–149)
VLDL Calculated: 21 mg/dL (ref 10–40)

## 2020-05-04 LAB — ALT: ALT: 44 U/L (ref 0–55)

## 2020-05-04 LAB — AST: AST (SGOT): 31 U/L (ref 5–34)

## 2020-05-04 LAB — GFR: EGFR: >60

## 2020-05-04 LAB — PSA: Prostate Specific Antigen, Total: 1.141 ng/mL (ref 0.000–4.000)

## 2021-01-19 ENCOUNTER — Other Ambulatory Visit (FREE_STANDING_LABORATORY_FACILITY): Payer: BC Managed Care – PPO

## 2021-01-19 DIAGNOSIS — E7801 Familial hypercholesterolemia: Secondary | ICD-10-CM

## 2021-01-19 DIAGNOSIS — Z79899 Other long term (current) drug therapy: Secondary | ICD-10-CM

## 2021-01-19 LAB — ALT: ALT: 34 U/L (ref 0–55)

## 2021-01-19 LAB — LIPID PANEL
Cholesterol / HDL Ratio: 3.6
Cholesterol: 159 mg/dL (ref 0–199)
HDL: 44 mg/dL (ref 40–9999)
LDL Calculated: 93 mg/dL (ref 0–99)
Triglycerides: 111 mg/dL (ref 34–149)
VLDL Calculated: 22 mg/dL (ref 10–40)

## 2021-01-19 LAB — GFR: EGFR: >60

## 2021-01-19 LAB — BASIC METABOLIC PANEL
Anion Gap: 7 (ref 5.0–15.0)
BUN: 11 mg/dL (ref 9.0–28.0)
CO2: 28 mEq/L (ref 21–29)
Calcium: 9 mg/dL (ref 8.5–10.5)
Chloride: 105 mEq/L (ref 100–111)
Creatinine: 0.8 mg/dL (ref 0.5–1.5)
Glucose: 88 mg/dL (ref 70–100)
Potassium: 4.2 mEq/L (ref 3.5–5.1)
Sodium: 140 mEq/L (ref 136–145)

## 2021-01-19 LAB — AST: AST (SGOT): 26 U/L (ref 5–34)

## 2021-01-19 LAB — C-REACTIVE PROTEIN: C-Reactive Protein: 0.2 mg/dL (ref 0.0–0.8)

## 2021-01-19 LAB — VITAMIN B12: Vitamin B-12: 925 pg/mL — ABNORMAL HIGH (ref 211–911)

## 2021-01-19 LAB — PSA: Prostate Specific Antigen, Total: 1.148 ng/mL (ref 0.000–4.000)

## 2021-01-19 LAB — URIC ACID: Uric acid: 6 mg/dL (ref 3.6–9.7)

## 2021-01-19 LAB — HEMOLYSIS INDEX: Hemolysis Index: 4 (ref 0–24)

## 2021-01-19 LAB — VITAMIN D,25 OH,TOTAL: Vitamin D, 25 OH, Total: 35 ng/mL (ref 30–100)

## 2021-11-24 ENCOUNTER — Other Ambulatory Visit: Payer: Self-pay

## 2021-11-24 DIAGNOSIS — Z86010 Personal history of colonic polyps: Secondary | ICD-10-CM | POA: Insufficient documentation

## 2021-11-24 DIAGNOSIS — Z800 Family history of malignant neoplasm of digestive organs: Secondary | ICD-10-CM

## 2021-11-24 DIAGNOSIS — Z8371 Family history of colonic polyps: Secondary | ICD-10-CM

## 2021-12-07 ENCOUNTER — Encounter: Payer: Self-pay | Admitting: Gastroenterology

## 2021-12-07 NOTE — Pre-Procedure Instructions (Signed)
Patient states; they and their family currently have no fever, cough, SOB, or symptoms of an acute respiratory illness. They have not travelled outside of the [place] in the last 14 days and have not had close contact with a confirmed COVID 19 patient. They do not live in a Nursing Home or long term care facility and do not have any special care needs.

## 2021-12-10 ENCOUNTER — Other Ambulatory Visit (FREE_STANDING_LABORATORY_FACILITY): Payer: BC Managed Care – PPO

## 2021-12-10 DIAGNOSIS — Z130 Encounter for screening for diseases of the blood and blood-forming organs and certain disorders involving the immune mechanism: Secondary | ICD-10-CM

## 2021-12-10 DIAGNOSIS — Z79899 Other long term (current) drug therapy: Secondary | ICD-10-CM

## 2021-12-10 DIAGNOSIS — E7801 Familial hypercholesterolemia: Secondary | ICD-10-CM

## 2021-12-10 DIAGNOSIS — Z125 Encounter for screening for malignant neoplasm of prostate: Secondary | ICD-10-CM

## 2021-12-10 DIAGNOSIS — Z1321 Encounter for screening for nutritional disorder: Secondary | ICD-10-CM

## 2021-12-10 DIAGNOSIS — Z13220 Encounter for screening for lipoid disorders: Secondary | ICD-10-CM

## 2021-12-10 LAB — BASIC METABOLIC PANEL
Anion Gap: 6 (ref 5.0–15.0)
BUN: 10 mg/dL (ref 9.0–28.0)
CO2: 28 mEq/L (ref 17–29)
Calcium: 9.1 mg/dL (ref 8.5–10.5)
Chloride: 108 mEq/L (ref 99–111)
Creatinine: 0.9 mg/dL (ref 0.5–1.5)
Glucose: 85 mg/dL (ref 70–100)
Potassium: 4.2 mEq/L (ref 3.5–5.3)
Sodium: 142 mEq/L (ref 135–145)

## 2021-12-10 LAB — VITAMIN B12: Vitamin B-12: 528 pg/mL (ref 211–911)

## 2021-12-10 LAB — LIPID PANEL
Cholesterol / HDL Ratio: 3.6 Index
Cholesterol: 147 mg/dL (ref 0–199)
HDL: 41 mg/dL (ref 40–9999)
LDL Calculated: 79 mg/dL (ref 0–99)
Triglycerides: 134 mg/dL (ref 34–149)
VLDL Calculated: 27 mg/dL (ref 10–40)

## 2021-12-10 LAB — AST: AST (SGOT): 26 U/L (ref 5–41)

## 2021-12-10 LAB — ALT: ALT: 28 U/L (ref 0–55)

## 2021-12-10 LAB — GFR: EGFR: >60

## 2021-12-10 LAB — VITAMIN D,25 OH,TOTAL: Vitamin D, 25 OH, Total: 49 ng/mL (ref 30–100)

## 2021-12-10 LAB — HEMOLYSIS INDEX: Hemolysis Index: 7 Index (ref 0–24)

## 2021-12-10 LAB — PSA: Prostate Specific Antigen, Total: 1.18 ng/mL (ref 0.000–4.000)

## 2021-12-10 LAB — URIC ACID: Uric acid: 5.9 mg/dL (ref 3.6–9.7)

## 2021-12-11 ENCOUNTER — Ambulatory Visit: Payer: BC Managed Care – PPO | Admitting: Anesthesiology

## 2021-12-11 ENCOUNTER — Encounter
Admission: RE | Disposition: A | Discharge status: Home / Self Care | Payer: Self-pay | Source: Ambulatory Visit | Attending: Gastroenterology

## 2021-12-11 ENCOUNTER — Ambulatory Visit: Payer: Self-pay

## 2021-12-11 ENCOUNTER — Ambulatory Visit
Admission: RE | Admit: 2021-12-11 | Discharge: 2021-12-11 | Disposition: A | Discharge status: Home / Self Care | Payer: BC Managed Care – PPO | Source: Ambulatory Visit | Attending: Gastroenterology | Admitting: Gastroenterology

## 2021-12-11 ENCOUNTER — Encounter: Payer: Self-pay | Admitting: Gastroenterology

## 2021-12-11 DIAGNOSIS — Z800 Family history of malignant neoplasm of digestive organs: Secondary | ICD-10-CM | POA: Insufficient documentation

## 2021-12-11 DIAGNOSIS — Z8371 Family history of colonic polyps: Secondary | ICD-10-CM | POA: Insufficient documentation

## 2021-12-11 DIAGNOSIS — K5730 Diverticulosis of large intestine without perforation or abscess without bleeding: Secondary | ICD-10-CM | POA: Insufficient documentation

## 2021-12-11 DIAGNOSIS — K648 Other hemorrhoids: Secondary | ICD-10-CM | POA: Insufficient documentation

## 2021-12-11 DIAGNOSIS — Z860100 Personal history of colon polyps, unspecified: Secondary | ICD-10-CM | POA: Insufficient documentation

## 2021-12-11 DIAGNOSIS — Z1211 Encounter for screening for malignant neoplasm of colon: Secondary | ICD-10-CM | POA: Insufficient documentation

## 2021-12-11 DIAGNOSIS — Z79899 Other long term (current) drug therapy: Secondary | ICD-10-CM | POA: Insufficient documentation

## 2021-12-11 DIAGNOSIS — Z83719 Family history of colon polyps, unspecified: Secondary | ICD-10-CM | POA: Insufficient documentation

## 2021-12-11 DIAGNOSIS — D123 Benign neoplasm of transverse colon: Secondary | ICD-10-CM | POA: Insufficient documentation

## 2021-12-11 HISTORY — PX: COLONOSCOPY, WITH BIOPSY: SHX3468

## 2021-12-11 SURGERY — COLONOSCOPY, WITH BIOPSY
Anesthesia: Anesthesia General | Site: Abdomen

## 2021-12-11 MED ORDER — PROPOFOL 10 MG/ML IV EMUL (WRAP)
INTRAVENOUS | Status: DC | PRN
Start: 2021-12-11 — End: 2021-12-11
  Administered 2021-12-11: 150 mg via INTRAVENOUS

## 2021-12-11 MED ORDER — PROPOFOL 10 MG/ML IV EMUL (WRAP)
INTRAVENOUS | Status: AC
Start: 2021-12-11 — End: ?
  Filled 2021-12-11: qty 50

## 2021-12-11 MED ORDER — PROPOFOL 10 MG/ML IV EMUL (WRAP)
INTRAVENOUS | Status: AC
Start: 2021-12-11 — End: ?
  Filled 2021-12-11: qty 20

## 2021-12-11 MED ORDER — PROPOFOL INFUSION 10 MG/ML
INTRAVENOUS | Status: DC | PRN
Start: 2021-12-11 — End: 2021-12-11
  Administered 2021-12-11: 160 ug/kg/min via INTRAVENOUS

## 2021-12-11 MED ORDER — LACTATED RINGERS IV SOLN
INTRAVENOUS | Status: DC | PRN
Start: 2021-12-11 — End: 2021-12-11

## 2021-12-11 SURGICAL SUPPLY — 38 items
CANISTER 1000CC (Procedure Accessories) ×2 IMPLANT
FORCEPS BIOPSY L240 CM +2.8 MM HOT OD2.2 (Endoscopic Supplies)
FORCEPS BIOPSY L240 CM +2.8 MM HOT OD2.2 MM RADIAL JAW (Endoscopic Supplies) IMPLANT
FORCEPS BIOPSY L240 CM MICROMESH TEETH STREAMLINE CATHETER NEEDLE (Instrument) IMPLANT
FORCEPS BIOPSY L240 CM STANDARD CAPACITY (Instrument)
FORCEPS BX +2.8MM RJ 4 2.2MM 240CM HOT (Endoscopic Supplies)
FORCEPS BX STD CPC RJ 4 2.2MM 240CM STRL (Instrument)
GOWN ISO YELLOW UNIVERSAL (Gown) ×4 IMPLANT
JELLY LUB EZ LF STRL H2O SOL NGRS TRNLU (Irrigation Solutions) ×2 IMPLANT
KIT UNIVERSAL IRRIGATION SOL (Kits) ×2 IMPLANT
NEEDLE CARR-LOCKE INJECT 25GX5 (Needles) IMPLANT
PAD ELECTROSRG GRND REM W CRD (Procedure Accessories) IMPLANT
PROBE COAG FIAPC 6.9FR 7.2FT CRCMF PLG (Endoscopic Supplies)
PROBE COAGULATION L7.2 FT (Endoscopic Supplies)
PROBE COAGULATION L7.2 FT CIRCUMFERENTIAL PLUG PLAY FUNCTIONALITY (Endoscopic Supplies) IMPLANT
PROBE ELECTROSURGICAL L220 CM FLEXIBLE (Procedure Accessories)
PROBE ELECTROSURGICAL L220 CM FLEXIBLE STRAIGHT FIRE OD2.3 MM FIAPC (Procedure Accessories) IMPLANT
PROBE ESURG FIAPC 2.3MM 220CM STRL FLXB (Procedure Accessories)
RESUSCITATOR MANUAL ADULT MASK 2 SWIVEL ELBOW CORRUGATE O2 TUBE SELF (Respiratory Supplies) IMPLANT
RESUSCITATOR MNL ARLF 40IN MSK 2 SWVL (Respiratory Supplies)
SNARE 9 MM L230 CM OD2.4 MM COLD BRAID (Instrument) ×1
SNARE 9 MM L230 CM OD2.4 MM EXACTO COLD BRAID WIRE CLEAN CUT (Instrument) IMPLANT
SNARE ESCP 9MM EXACTO 2.4MM 230CM LF (Instrument) ×1
SNARE ESCP MIC CPTVTR 13MM 240IN STRL (GE Lab Supplies)
SNARE SMALL HEXAGON CAPTIVATOR STIFF ENDOSCOPIC POLYPECTOMY (GE Lab Supplies) IMPLANT
SNARE THROW SENS SHRT STD OVA (Endoscopic Supplies) IMPLANT
SOFT-CUF 2T ADULT SUB-MIN (Cuff) ×2 IMPLANT
SPONGE GAUZE L4 IN X W4 IN 16 PLY (Dressing) ×10
SPONGE GAUZE L4 IN X W4 IN 16 PLY MAXIMUM ABSORBENT USP TYPE VII (Dressing) ×10 IMPLANT
SPONGE GZE CTTN CRTY 4X4IN LF NS 16 PLY (Dressing) ×10
SYRINGE 50 ML GRADUATE NONPYROGENIC DEHP (Syringes, Needles) ×1
SYRINGE 50 ML GRADUATE NONPYROGENIC DEHP FREE PVC FREE BD MEDICAL (Syringes, Needles) ×1 IMPLANT
SYRINGE MED 50ML LF STRL GRAD N-PYRG (Syringes, Needles) ×1
WATER STERILE PLASTIC POUR BOTTLE 1000 (Irrigation Solutions) ×1
WATER STERILE PLASTIC POUR BOTTLE 1000 ML (Irrigation Solutions) ×1 IMPLANT
WATER STERILE PLASTIC POUR BOTTLE 250 ML (Irrigation Solutions) ×1 IMPLANT
WATER STRL 1000ML PLS PR BTL LF (Irrigation Solutions) ×1
WATER STRL 250ML LF PLS PR BTL (Irrigation Solutions) ×1

## 2021-12-11 NOTE — Anesthesia Postprocedure Evaluation (Signed)
Anesthesia Post Evaluation    Patient: Jonathon Hudson    Procedure(s):  COLONOSCOPY, BIOPSY    Anesthesia type: general    Last Vitals:   Vitals Value Taken Time   BP 115/72 12/11/21 1040   Temp 36.2 C (97.2 F) 12/11/21 0959   Pulse 53 12/11/21 1040   Resp 16 12/11/21 1040   SpO2 98 % 12/11/21 1040                 Anesthesia Post Evaluation:     Patient Evaluated: PACU  Patient Participation: complete - patient participated  Level of Consciousness: awake    Pain Management: adequate  Multimodal analgesia pain management approach    Airway Patency: patent    Anesthetic complications: No      PONV Status: none    Cardiovascular status: acceptable  Respiratory status: acceptable  Hydration status: acceptable        Signed by: Joaquim Lai, MD, 12/11/2021 12:18 PM

## 2021-12-11 NOTE — Discharge Instructions (Signed)
Diverticulosis  Diverticulosis means that small pouches have formed in the wall of your large intestine (colon). Most often, this problem causes no symptoms and is common as people age. But the pouches in the colon are at risk of becoming infected. When this happens, the condition is called diverticulitis. Although most people with diverticulosis never develop diverticulitis, it's still not uncommon. Rectal bleeding can also occur and in less common situations, a type of colon inflammation called colitis.   Most people don't have symptoms,. But some people with diverticulosis may have:   Belly (abdominal) cramps and pain  Bloating  Constipation  Change in bowel habits  Causes  The exact cause of diverticulosis (and diverticulitis) has not been proved, but a few things are linked with the condition:   Low-fiber diet. But some experts don't agree about this.  Constipation  Lack of exercise  Your healthcare provider will talk with you about how to manage your condition. Diet changes may be all that you need to help control diverticulosis and prevent its becoming diverticulitis. If you develop diverticulitis, you will likely need other treatments.   Home care  You may be told to take fiber supplements daily. Fiber adds bulk to the stool so that it passes through the colon more easily. Stool softeners may also be recommended. You may also be given medicines for pain relief. Be sure to take all medicines as directed.   In the past, people were told to not have corn, nuts, or seeds. You no longer need to do this.   Follow these guidelines when caring for yourself at home:  Eat unprocessed foods that are high in fiber. Whole grains, fruits, and vegetables are good choices.  Drink 6 to 8 glasses of water every day unless your healthcare provider has you limit how much fluid you should have.  Watch for changes in your bowel movements. Tell your provider if you notice any changes.  Begin an exercise program. Ask your  provider how to get started. Generally, walking is the best.  Get plenty of rest and sleep.  Follow-up care  Follow up with your healthcare provider, or as advised. You may need regular visits to check on your health. Sometimes you may need special procedures such as colonoscopy after an episode of diverticulitis or blooding. Be sure to keep all your appointments.   If a stool sample was taken, or cultures were done, you'll be told if they are positive, or if your treatment needs to be changed. You can call as directed for the results.   If X-rays were done, you'll be told of any new findings that may affect your care.   If antibiotics were prescribed, be sure to finish them all.  When to seek medical advice  Call your healthcare provider right away if any of these occur:  Fever of 100.4F (38C) or higher , or as directed by your healthcare provider  Severe cramps in the lower left side of the belly (abdomen) or pain that's getting worse  Tenderness in the lower left side of the abdomen or pain throughout the abdomen that gets worse  Diarrhea or constipation that doesn't get better within 24 hours  Nausea and vomiting  Slight bleeding from the rectum  Call 911  Call 911 if any of the following occur:   Large amount of bleeding from the rectum  Trouble breathing  Confusion  Very drowsy or trouble awakening  Fainting or loss of consciousness  Rapid heart rate  Chest   pain  [employer] last reviewed this educational content on 09/30/2020   2000-2022 [employer]. All rights reserved. This information is not intended as a substitute for professional medical care. Always follow your healthcare professional's instructions.        Diagnosing Hemorrhoids  To diagnose hemorrhoids, your healthcare provider will rule out other problems and determine how bad your hemorrhoids are. After the evaluation, your provider will help you decide on a treatment plan that's best for you.   Health history   A health history helps  your healthcare provider learn more about your symptoms and overall health. This often includes questions about your bowel habits and diet. You may also be asked how often you exercise and if you take any medicines. Tell your provider if any members of your family have had cancer or polyps of the colon. It's also important to tell your provider if you're having any rectal bleeding.   Physical exam   During a physical exam, you'll be asked to lie on an exam table. You'll then be checked for signs of swollen hemorrhoids and other problems. The exam takes just a few minutes. It's usually not painful and may include:   Visual exam. This is done to view the outer anal skin. External hemorrhoids are always outside of the anal canal. They can be seen by just looking. Some internal hemorrhoids may also bulge or protrude out of the anal canal. These are also visible by just looking.  Digital rectal exam. This is used to check for hemorrhoids or other problems in the anal canal. It's done using a lubricated gloved finger.  Anoscopic exam. This is done using a short, clear plastic viewing tube called an anoscope. The scope helps your provider view the anal canal.  Grading hemorrhoids   Based on the physical exam, your healthcare provider may assign a grade to internal hemorrhoids. The grades are based on how severe your symptoms are:   Grade I hemorrhoids. These don't bulge or protrude from the anus. They may bleed, but otherwise cause few symptoms.  Grade II hemorrhoids. These protrude from the anus during bowel movements. They reduce back into the anal canal when straining stops.  Grade III hemorrhoids. These protrude on their own or with straining. They don't reduce by themselves. But they can be pushed back into place.  Grade IV hemorrhoids. These protrude and can't be reduced at all. They can also be painful and may need treatment right away.  Pregnancy and hemorrhoids  Many women get hemorrhoids during pregnancy and  childbirth. This is likely caused by pressure on the pelvis and by hormonal changes. In most cases, the hemorrhoids will go away on their own over time. In the meantime, talk with your healthcare provider about ways to help ease your symptoms.   Other anal problems  Below are common problems that can cause symptoms similar to hemorrhoids:   Fissure. This is a small tear or crack in the lining of the anus. It can be caused by hard bowel movements, diarrhea, or inflammation in the rectal area. Fissures can bleed and cause painful bowel movements.  Abscess. This is an infected gland in the anal canal. The infected area swells and often causes pain.  Fistula. This is a pathway that may form when an anal abscess drains. The pathway may remain after the abscess is gone. Fistulas aren't often painful. But they can cause drainage where the pathway meets the skin.  [employer] last reviewed this educational content   on 08/30/2020   2000-2022 [employer]. All rights reserved. This information is not intended as a substitute for professional medical care. Always follow your healthcare professional's instructions.        Understanding Colon and Rectal Polyps  The colon (also called the large intestine) is a muscular tube that forms the last part of the digestive tract. It absorbs water and stores food waste. The colon is about 4 to 6 feet long. The rectum is the last 6 inches of the colon. The colon and rectum have a smooth lining composed of millions of cells. Changes in these cells can lead to growths called polyps in the colon. These can become cancerous and should be removed. Many tests are available to screen for colon cancer. But colonoscopy is the only test that looks directly into the entire large intestine and allows for treatment right away. During colonoscopy, these polyps can be removed. How often you need this test depends on many things. These include your condition, your family history, symptoms, and  what the findings were at the previous colonoscopy.    When the colon lining changes  Changes that happen in the cells that line the colon or rectum can lead to growths called polyps. Over a period of years, polyps can turn into cancer. Removing polyps early may prevent cancer from ever forming.   Polyps  Polyps are fleshy clumps of tissue that form on the lining of the colon or rectum. Small polyps are usually not cancer (benign). But over time, cells in a polyp can change and become precancerous. Certain types of polyps known as adenomatous polyps and serrated polyps are precancerous. The risk for cancer also increases with the size of the polyp and certain cell and gene features. This means that they may become cancerous if they're not removed. Hyperplastic polyps are benign. They can grow quite large and not turn cancerous.      Cancer  Almost all colorectal cancers start when polyp cells start growing abnormally. As a cancerous tumor grows, it may affect more and more of the colon or rectum. In time, cancer can also grow beyond the colon or rectum and spread to nearby organs or to glands called lymph nodes. The cells can also travel to other parts of the body. This is known as metastasis. The earlier a cancerous tumor is removed, the better the chance of preventing its spread.     [employer] last reviewed this educational content on 06/30/2020   2000-2022 [employer]. All rights reserved. This information is not intended as a substitute for professional medical care. Always follow your healthcare professional's instructions.

## 2021-12-11 NOTE — Anesthesia Preprocedure Evaluation (Signed)
Anesthesia Evaluation    AIRWAY    Mallampati: II    TM distance: >3 FB  Neck ROM: full  Mouth Opening:full   CARDIOVASCULAR    cardiovascular exam normal       DENTAL                   PULMONARY    pulmonary exam normal     OTHER FINDINGS    Denies loose teeth                                  Relevant Problems   No relevant active problems               Anesthesia Plan    ASA 2     general               (HL  Able to climb 2 flights of stairs without CP/SOB    Denies hx of anesthesia complications    Allergy to Dilantin - taken for muscle spasm - denies hx seizures)      intravenous induction   Detailed anesthesia plan: general IV        Post op pain management: per surgeon and PO analgesics    informed consent obtained    Plan discussed with CRNA.                   Signed by: Joaquim Lai, MD 12/11/21 9:06 AM

## 2021-12-11 NOTE — Discharge Instr - AVS First Page (Addendum)
Instructions for after your discharge:  General Instructions:  Dr. Hurley [phone]    Following sedation, your judgment, perception, and coordination are considered impaired. Even though you may feel awake and alert, you are considered legally intoxicated. Therefore, until the next morning:  Do not drive  Do not operate appliances or equipment that requires reaction time (e.g. stove, electrical tools, machinery)  Do not sign legal documents or be involved in important decisions  Do not smoke if alone  Do not drink alcoholic beverages    Go directly home and rest for several hours before resuming your routine activities  It is highly recommended to have a responsible adult stay with you for the next 24 hours    Tenderness, swelling, or pain may occur at the IV site where you received sedation. If you experience this, apply warm soaks to the area. Notify your physician if this persists.   If you have questions or problems contact your MD immediately. If you need immediate attention, call your MD, 911 and/or go to the nearest emergency room.       Report to the Physician any of the following:    For Colonoscopy,   Severe and persistent abdominal pain/bloating which does not subside within 2-3 hours  Large amount of rectal bleeding (some mucous blood streaking may occur, especially if biopsy or polypectomy was done or if hemorrhoids are present  Nausea/vomiting  Fever/chills within 24 hours after procedure. Temp > 101 degrees Farenheit    * If polyp has been removed, DO NOT take aspirin or aspirin-containing products (e.g. Anacin, Alka Seltzer, Bufferin, etc) or non-steroidal anti-inflammatory drugs (e.g. Advil, Motrin, etc) for 2 days unless otherwise advised by doctor. Tylenol or Extra Strength Tylenol is permitted.   *Special instructions: Follow MD instructions on procedural report.       Lower GI

## 2021-12-11 NOTE — H&P (Signed)
GI PRE PROCEDURE NOTE    Proceduralist Comments:   Review of Systems and Past Medical / Surgical History performed: Yes     Indications:Family history of colon cancer, History of colonic polyps, and Family history of colon polyps    Outpatient Medications Marked as Taking for the 12/11/21 encounter [facility] Encounter)   Medication Sig Dispense Refill    Multiple Vitamins-Minerals (multivitamin with minerals) tablet Take 1 tablet by mouth daily      rosuvastatin (CRESTOR) 10 MG tablet Take 10 mg by mouth daily         Allergies   Allergen Reactions    Dilantin [Phenytoin] Rash    Naproxen Hives       Social History - non-contributory  Family History - non-contributory    Review of Systems - General ROS: negative for - chills, fever or malaise  Respiratory ROS: no cough, shortness of breath, or wheezing  Cardiovascular ROS: no chest pain or dyspnea on exertion  Gastrointestinal ROS: no abdominal pain, change in bowel habits, or black or bloody stools      Physical Exam / Laboratory Data (If applicable)       General: Alert and cooperative  Lungs: Lungs clear to auscultation  Cardiac: RRR, normal S1S2.    Abdomen: Soft, non tender. Normal active bowel sounds  Neurological: denies headache weakness  Other:       History of colonic polyps [Z86.010]  Family history of colonic polyps [Z83.71]  Family history of colon cancer [Z80.0]          colonoscopy with possible biopsy/polypectomy        Planned Sedation:   Deep sedation with anesthesia    Attestation:   Jonathon Hudson has been reassessed immediately prior to the procedure and is an appropriate candidate for the planned sedation and procedure. Risks, benefits and alternatives to the planned procedure and sedation have been explained to the patient or guardian:  yes        Signed by: Virgina Norfolk, MD

## 2021-12-11 NOTE — Transfer of Care (Signed)
Anesthesia Transfer of Care Note    Patient: Jonathon Hudson    Procedures performed: Procedure(s):  COLONOSCOPY, BIOPSY    Anesthesia type: General TIVA    Patient location:Phase I PACU    Last vitals:   Vitals:    12/11/21 0959   BP: 96/51   Pulse: (!) 54   Resp: 18   Temp: 36.2 C (97.2 F)   SpO2: 99%       Post pain: Patient not complaining of pain, continue current therapy      Mental Status:awake    Respiratory Function: tolerating face mask    Cardiovascular: stable    Nausea/Vomiting: patient not complaining of nausea or vomiting    Hydration Status: adequate    Post assessment: no apparent anesthetic complications    Signed by: Meriel Flavors, CRNA  12/11/21 10:00 AM

## 2021-12-12 ENCOUNTER — Encounter: Payer: Self-pay | Admitting: Gastroenterology

## 2021-12-14 LAB — LAB USE ONLY - HISTORICAL SURGICAL PATHOLOGY

## 2023-01-13 ENCOUNTER — Other Ambulatory Visit (FREE_STANDING_LABORATORY_FACILITY): Payer: BC Managed Care – PPO

## 2023-01-13 DIAGNOSIS — I10 Essential (primary) hypertension: Secondary | ICD-10-CM

## 2023-01-13 DIAGNOSIS — Z79899 Other long term (current) drug therapy: Secondary | ICD-10-CM

## 2023-01-13 DIAGNOSIS — E7801 Familial hypercholesterolemia: Secondary | ICD-10-CM

## 2023-01-13 LAB — LIPID PANEL
Cholesterol / HDL Ratio: 3.4 Index
Cholesterol: 140 mg/dL (ref 0–199)
HDL: 41 mg/dL (ref 40–9999)
LDL Calculated: 84 mg/dL (ref 0–99)
Triglycerides: 75 mg/dL (ref 34–149)
VLDL Calculated: 15 mg/dL (ref 10–40)

## 2023-01-13 LAB — BASIC METABOLIC PANEL
Anion Gap: 9 (ref 5.0–15.0)
BUN: 10 mg/dL (ref 9.0–28.0)
CO2: 26 mEq/L (ref 17–29)
Calcium: 9.5 mg/dL (ref 8.5–10.5)
Chloride: 107 mEq/L (ref 99–111)
Creatinine: 0.9 mg/dL (ref 0.5–1.5)
Glucose: 94 mg/dL (ref 70–100)
Potassium: 4 mEq/L (ref 3.5–5.3)
Sodium: 142 mEq/L (ref 135–145)
eGFR: >60 mL/min/{1.73_m2} (ref >=60)

## 2023-01-13 LAB — PSA: Prostate Specific Antigen, Total: 0.972 ng/mL (ref 0.000–4.000)

## 2023-01-13 LAB — VITAMIN D,25 OH,TOTAL: Vitamin D, 25 OH, Total: 85 ng/mL (ref 30–100)

## 2023-01-13 LAB — HEMOLYSIS INDEX: Hemolysis Index: 6 Index (ref 0–24)

## 2023-01-13 LAB — AST: AST (SGOT): 29 U/L (ref 5–41)

## 2023-01-13 LAB — URIC ACID: Uric acid: 6 mg/dL (ref 3.6–9.7)

## 2023-01-13 LAB — ALT: ALT: 28 U/L (ref 0–55)

## 2023-01-13 LAB — VITAMIN B12: Vitamin B-12: 536 pg/mL (ref 211–911)

## 2023-11-28 ENCOUNTER — Other Ambulatory Visit (INDEPENDENT_AMBULATORY_CARE_PROVIDER_SITE_OTHER)

## 2023-11-28 DIAGNOSIS — E7801 Familial hypercholesterolemia: Secondary | ICD-10-CM

## 2023-11-28 DIAGNOSIS — Z79899 Other long term (current) drug therapy: Secondary | ICD-10-CM

## 2023-11-28 DIAGNOSIS — Z0001 Encounter for general adult medical examination with abnormal findings: Secondary | ICD-10-CM

## 2023-11-28 DIAGNOSIS — I10 Essential (primary) hypertension: Secondary | ICD-10-CM

## 2023-11-28 LAB — BASIC METABOLIC PANEL
Anion Gap: 9 (ref 5.0–15.0)
BUN: 11 mg/dL (ref 9–28)
CO2: 27 meq/L (ref 17–29)
Calcium: 8.9 mg/dL (ref 8.5–10.5)
Chloride: 106 meq/L (ref 99–111)
Creatinine: 0.8 mg/dL (ref 0.5–1.5)
GFR: >60 mL/min/1.73 m2 (ref >=60.0)
Glucose: 89 mg/dL (ref 70–100)
Hemolysis Index: 6 {index}
Potassium: 4.2 meq/L (ref 3.5–5.3)
Sodium: 142 meq/L (ref 135–145)

## 2023-11-28 LAB — FOLATE
Folate: 7.4 ng/mL (ref >5.4)
Hemolysis Index: 6 {index}

## 2023-11-28 LAB — VITAMIN D, 25 OH, TOTAL: Vitamin D 25-OH, Total: 46 ng/mL (ref 30–100)

## 2023-11-28 LAB — PSA TOTAL, ANNUAL SCREENING: Prostate Specific Antigen, Total: 1.5 ng/mL (ref 0.000–4.000)

## 2023-11-28 LAB — LIPID PANEL
Cholesterol / HDL Ratio: 3.2 {index}
Cholesterol: 146 mg/dL (ref <=199)
HDL: 45 mg/dL (ref >=40)
LDL Calculated: 79 mg/dL (ref 0–99)
Triglycerides: 110 mg/dL (ref 34–149)
VLDL Calculated: 22 mg/dL (ref 10–40)

## 2023-11-28 LAB — VITAMIN B12: Vitamin B-12: 352 pg/mL (ref 211–911)

## 2023-11-28 LAB — AST: AST (SGOT): 41 U/L (ref <=41)

## 2023-11-28 LAB — ALT: ALT: 34 U/L (ref <=55)

## 2024-01-07 IMAGING — DX CERVICAL SPINE 2 VIEWS
2 series · 2 of 2 positions shown · non-contrast
Comparison: None

________________________________________________________________________________________________ 
CERVICAL SPINE 2 VIEWS, 01/07/2024 [DATE]: 
CLINICAL INDICATION: Cervicalgia

[AP]
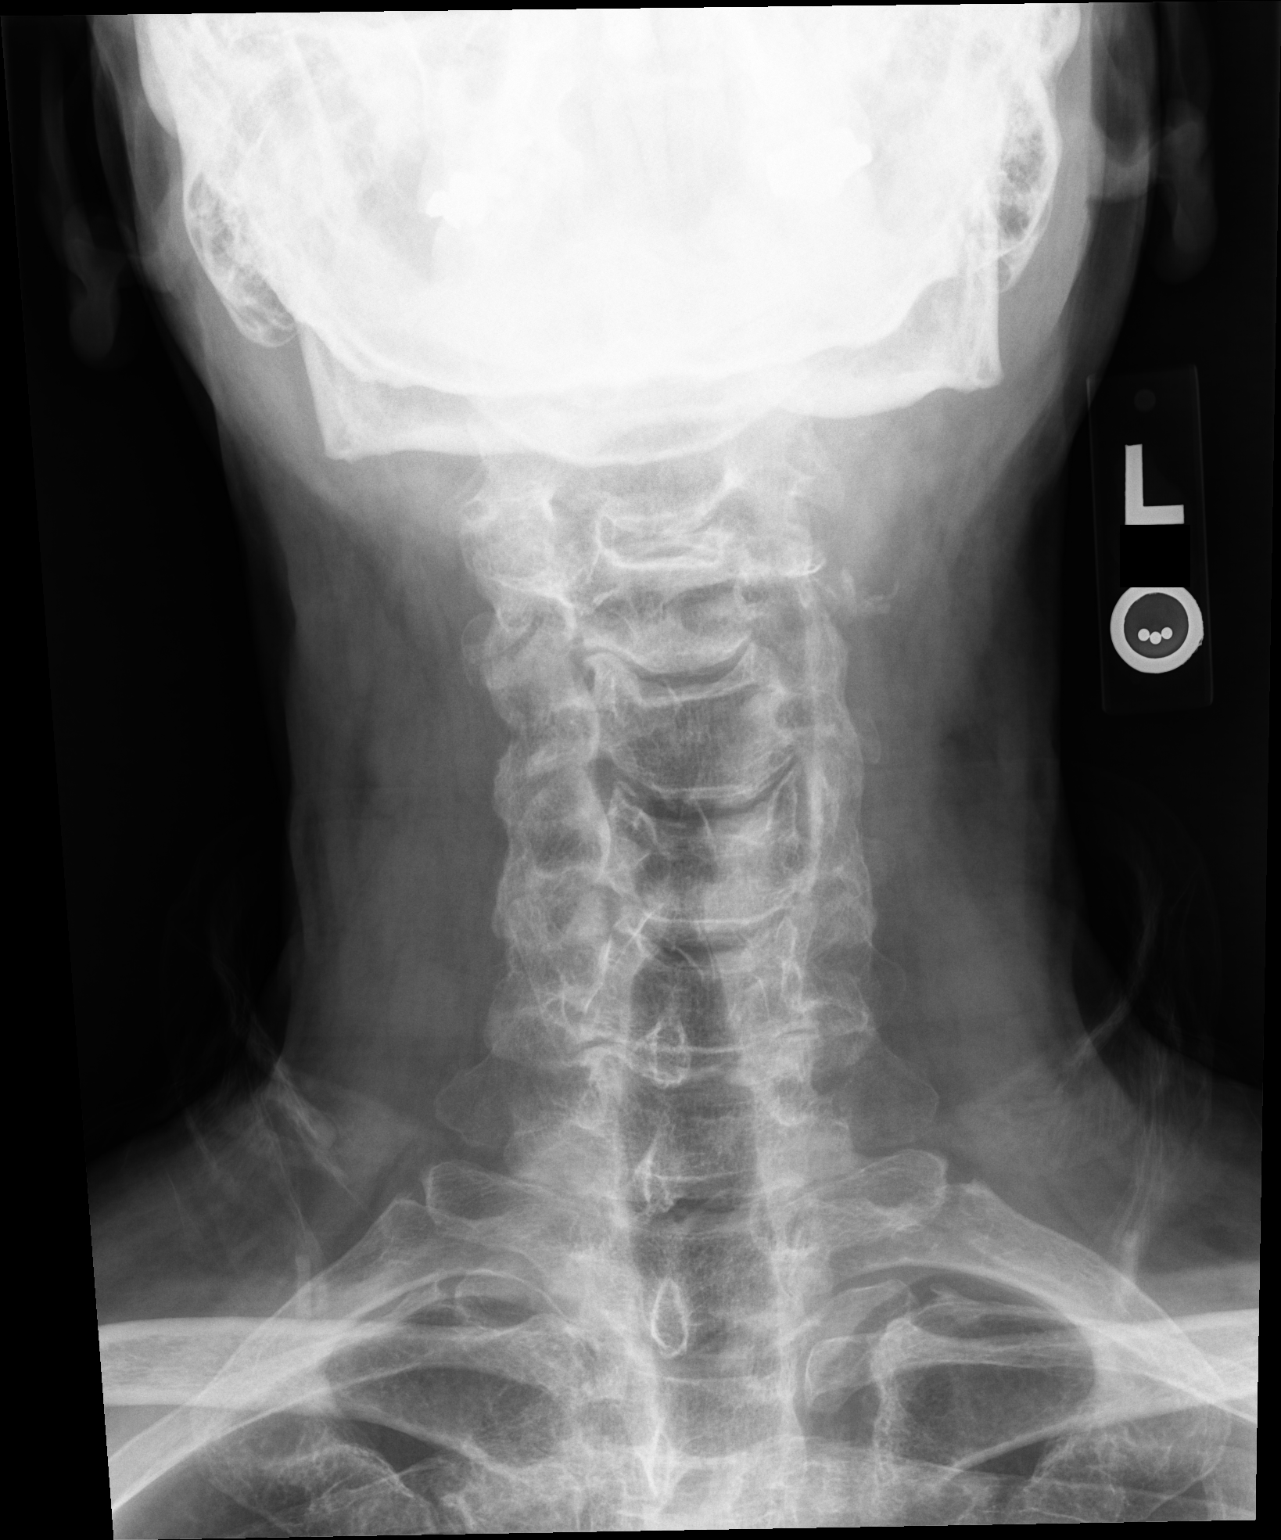

[lateral]
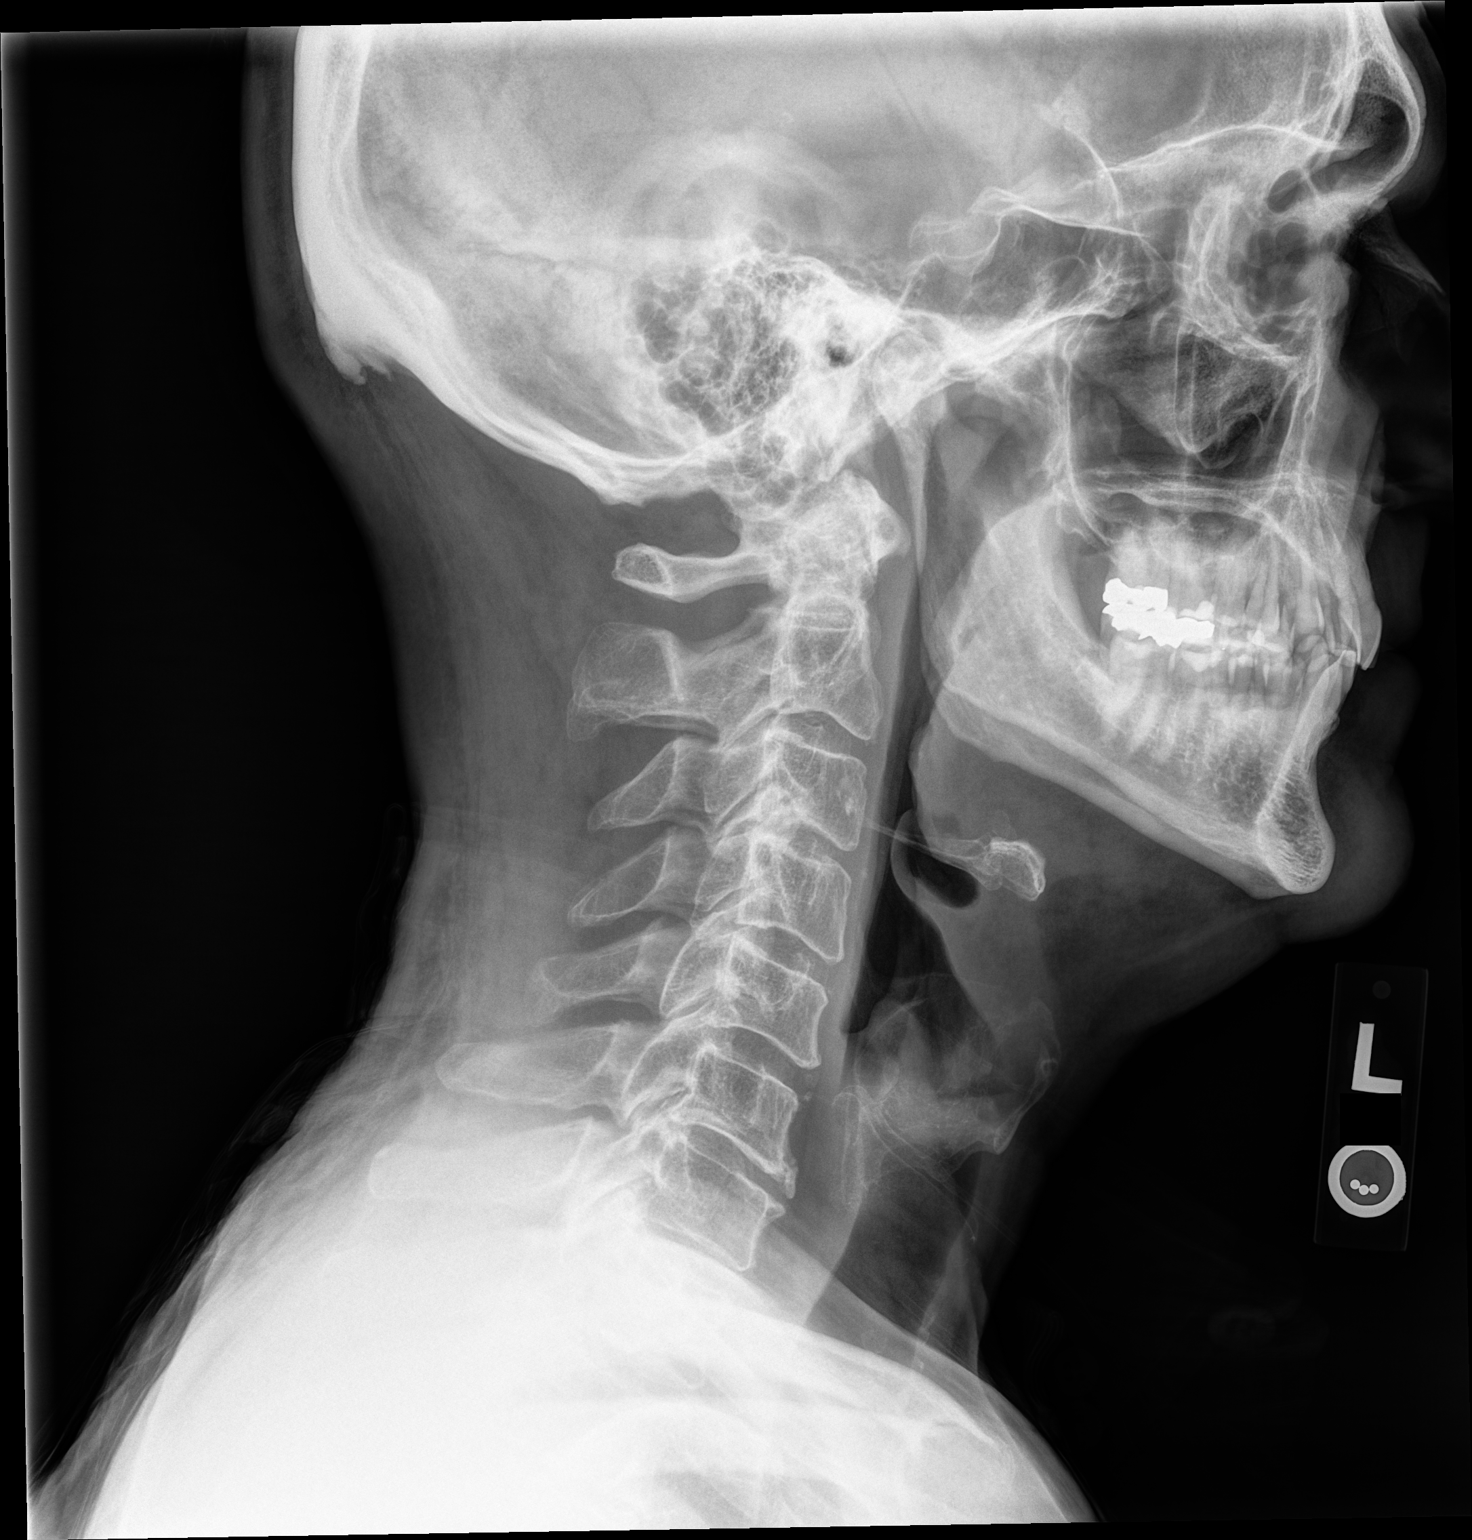

[2 of 2 positions shown; findings below may reference images not displayed]

FINDINGS: Mild scattered disc height loss and osteophytic spurring. Multilevel 
facet hypertrophy. No vertebral body fracture. No spondylolisthesis. Lung apices 
are clear.
IMPRESSION: Mild spondylotic changes cervical spine. 
Patient is scheduled for MR exam on 01/12/2024.

## 2024-01-12 IMAGING — MR MRI CERVICAL SPINE WITHOUT CONTRAST
5 of 12 series · 9 of 48 positions shown · IV contrast (gadolinium)
Comparison: X-ray from January 07, 2024.

________________________________________________________________________________________________ 
MRI CERVICAL SPINE WITHOUT CONTRAST, 01/12/2024 [DATE]: 
CLINICAL INDICATION: Pain on right side of neck with radiation down right arm.
TECHNIQUE: Multiplanar, multiecho position MR images of the cervical spine were 
performed without intravenous gadolinium enhancement.

[Series 301: survey · axial · 10.0mm · 0.94mm/px · z∈[-15,+150]mm · 2 of 9 slices shown]
[im 1/9]
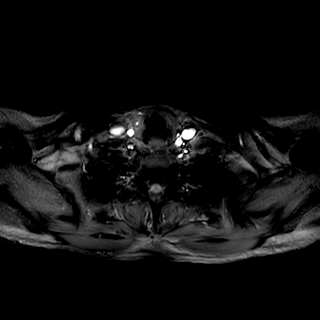
[im 9/9]
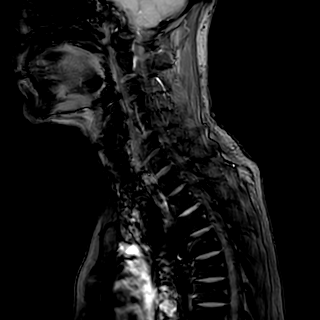

[Series 401: t2w_tse cor/cs · coronal · 5.0mm · 0.52mm/px · 1 of 7 slices shown]
[im 1/7]
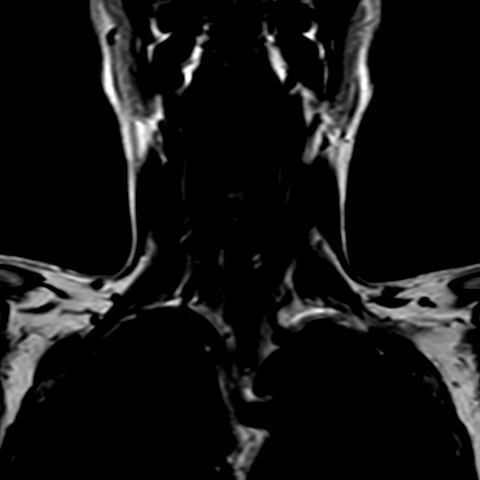

[Series 501: T1 · sagittal · 3.0mm · 0.43mm/px · 1 of 15 slices shown]
[im 1/15]
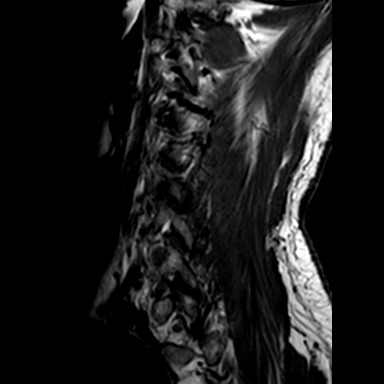

[Series 702: 3dt2 cor/mpr · coronal · 1.0mm · 0.15mm/px · 3 of 73 slices shown]
[im 1/73]
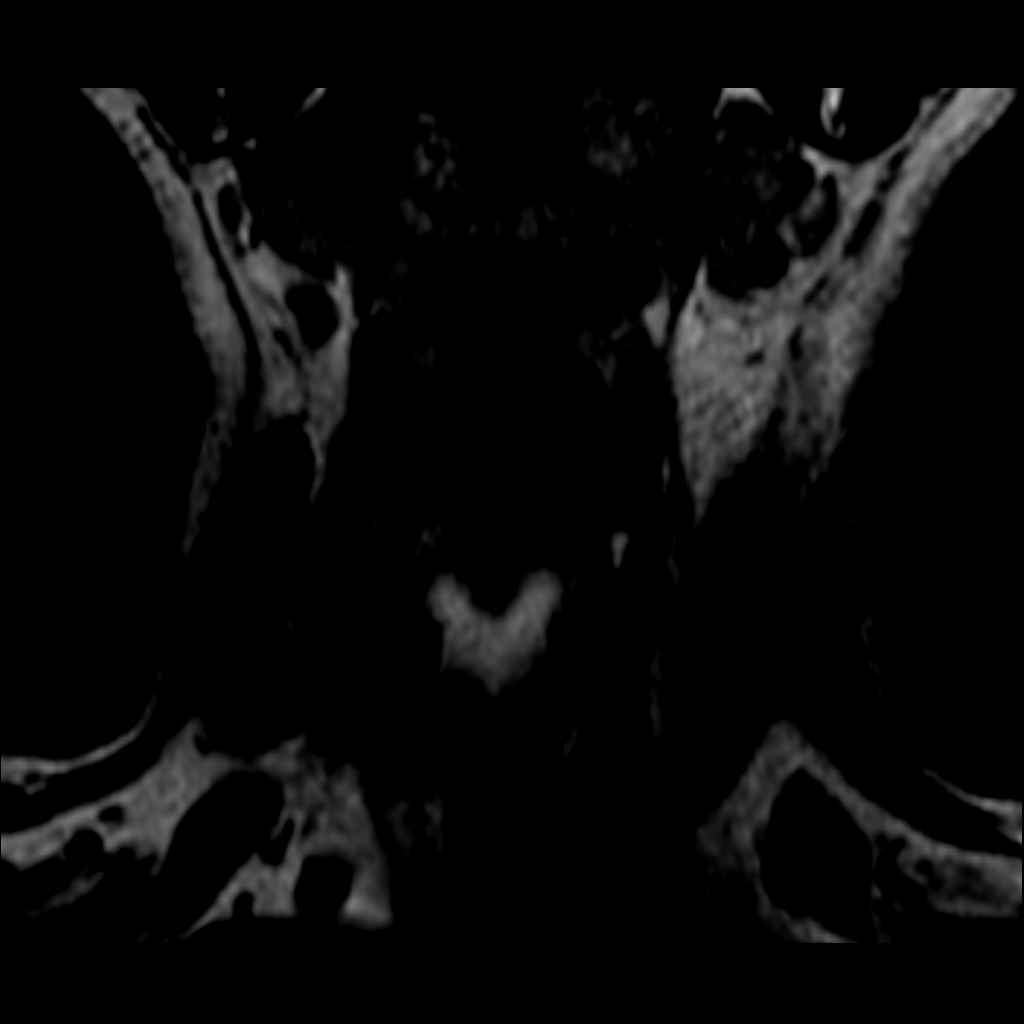
[im 15/73]
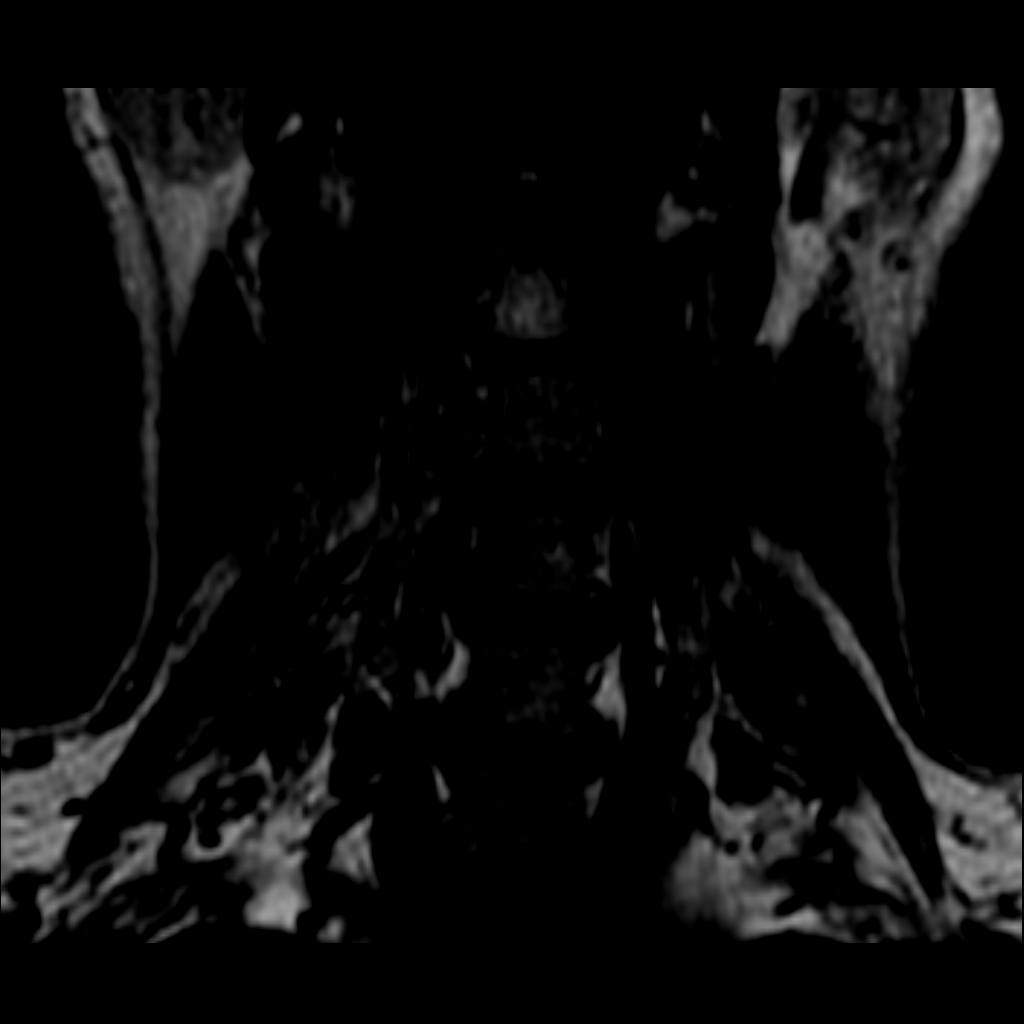
[im 29/73]
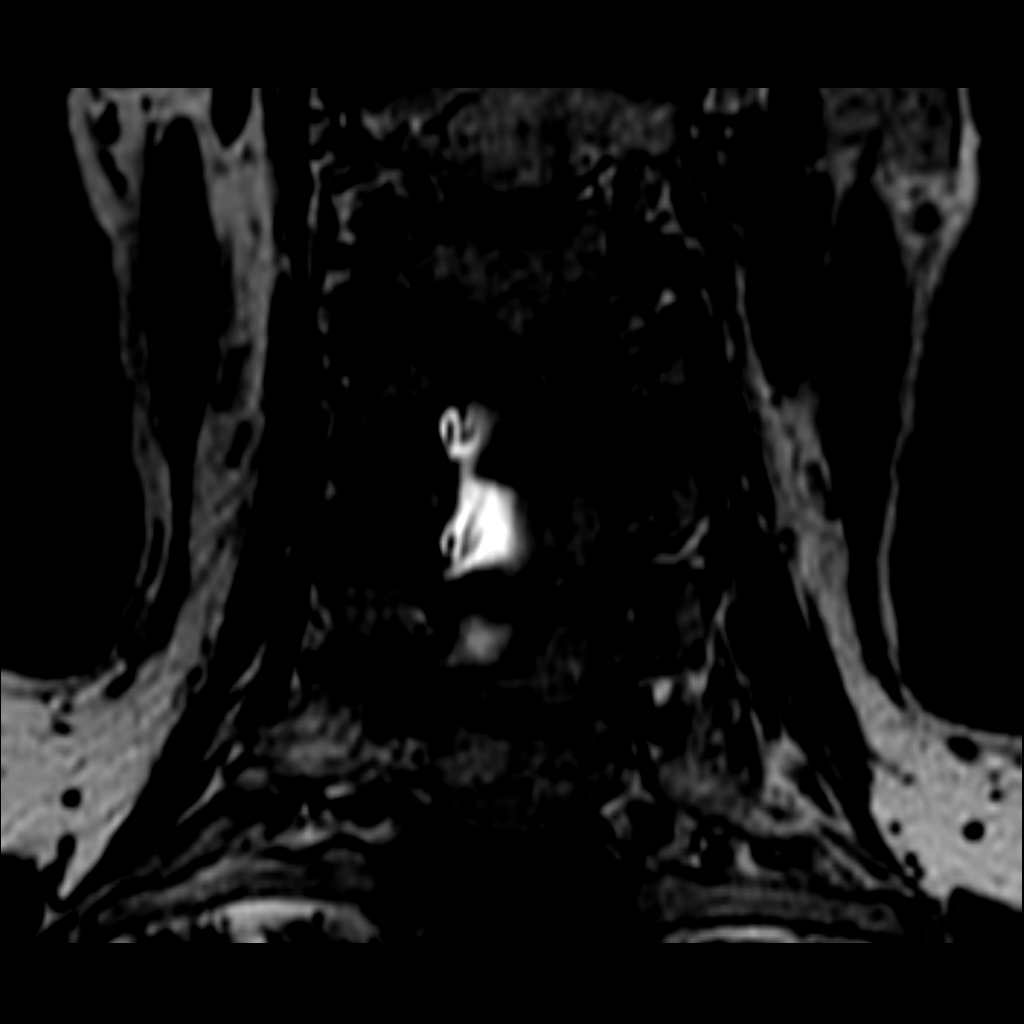

[Series 801: T2 · axial · 3.0mm · 0.34mm/px · z∈[-56,+48]mm · 2 of 19 slices shown]
[im 1/19]
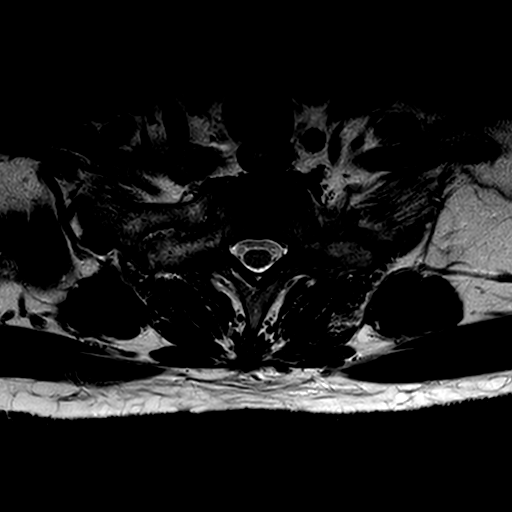
[im 19/19]
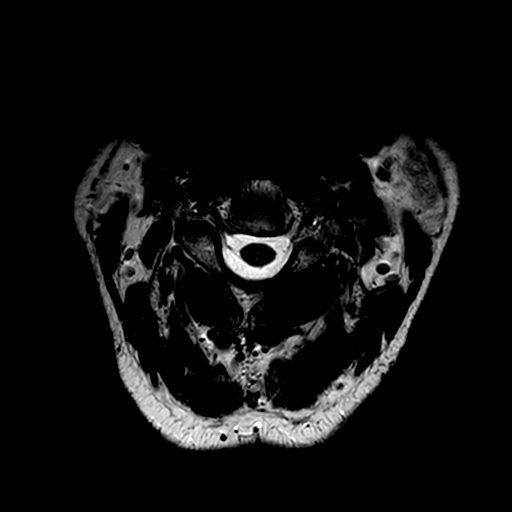

[9 of 48 positions shown; findings below may reference images not displayed]

FINDINGS: -------------------------------------------------------------------------------- 
----------------- 
GENERAL: 
ALIGNMENT: Mild retrolisthesis of C3 on C4, C6 on C7. Leftward curvature of the 
cervical spine is noted. 
VERTEBRAL BODY HEIGHT: Normal.  
MARROW SIGNAL: Schmorls node along superior endplate of T2 and along the 
inferior endplate of C3 towards right side. 
CORD SIGNAL: Normal.  
ADDITIONAL FINDINGS: None. 
-------------------------------------------------------------------------------- 
---------------- 
SEGMENTAL: 
CRANIOCERVICAL JUNCTION: No significant stenosis. 
C2-C3: No significant central canal or neural foraminal narrowing. 
C3-C4: Loss of disc height with disc osteophyte complex eccentric to the right, 
right uncovertebral joint hypertrophy, right facet hypertrophy; mild deformity 
of the ventral cord with mild central canal narrowing. Severe right neural 
foraminal narrowing. No significant left neural foraminal narrowing. 
C4-C5: Left uncovertebral joint hypertrophy greater than right. No significant 
central canal narrowing. Severe left neural foraminal narrowing. Moderate right 
neural foraminal narrowing. 
C5-C6: Mild disc osteophyte complex. Bilateral uncovertebral joint hypertrophy. 
Very mild central canal narrowing. Moderate right neural foraminal narrowing. 
Severe left neural foraminal narrowing. 
C6-C7: Disc osteophyte complex with bilateral uncovertebral joint hypertrophy. 
Mild central canal narrowing. Severe bilateral neural foraminal narrowing. 
C7-T1: Bilateral uncovertebral joint and facet hypertrophy. No significant 
central canal narrowing. Mild bilateral neural foraminal narrowing. 
-------------------------------------------------------------------------------- 
---------------
IMPRESSION: 1.  Discogenic/degenerative changes as above. 
2.  Cord signal abnormality: None. 
3.  Cord deformity: C3-C4 
4.  Severe neural foraminal narrowing: C3-C4 (right), C4-C5 (left), C5-C6 
(left), C6-C7 (bilateral).
